# Patient Record
Sex: Female | Born: 1970 | Race: White | Hispanic: No | Marital: Married | Smoking: Never smoker
Health system: Southern US, Community
[De-identification: ages and names within clinical notes are randomized; demographics above are authoritative.]

## PROBLEM LIST (undated history)

## (undated) ENCOUNTER — Inpatient Hospital Stay (HOSPITAL_COMMUNITY): Payer: Self-pay

## (undated) DIAGNOSIS — R609 Edema, unspecified: Secondary | ICD-10-CM

## (undated) DIAGNOSIS — G932 Benign intracranial hypertension: Secondary | ICD-10-CM

## (undated) DIAGNOSIS — J45909 Unspecified asthma, uncomplicated: Secondary | ICD-10-CM

## (undated) DIAGNOSIS — IMO0001 Reserved for inherently not codable concepts without codable children: Secondary | ICD-10-CM

## (undated) DIAGNOSIS — I10 Essential (primary) hypertension: Secondary | ICD-10-CM

## (undated) DIAGNOSIS — Z794 Long term (current) use of insulin: Secondary | ICD-10-CM

## (undated) DIAGNOSIS — E119 Type 2 diabetes mellitus without complications: Secondary | ICD-10-CM

## (undated) DIAGNOSIS — I639 Cerebral infarction, unspecified: Secondary | ICD-10-CM

---

## 2000-12-31 ENCOUNTER — Encounter: Payer: Self-pay | Admitting: Emergency Medicine

## 2000-12-31 ENCOUNTER — Emergency Department (HOSPITAL_COMMUNITY): Admission: EM | Admit: 2000-12-31 | Discharge: 2000-12-31 | Payer: Self-pay | Admitting: Emergency Medicine

## 2009-01-31 ENCOUNTER — Emergency Department (HOSPITAL_COMMUNITY): Admission: EM | Admit: 2009-01-31 | Discharge: 2009-01-31 | Payer: Self-pay | Admitting: *Deleted

## 2009-04-09 ENCOUNTER — Emergency Department (HOSPITAL_COMMUNITY): Admission: EM | Admit: 2009-04-09 | Discharge: 2009-04-09 | Payer: Self-pay | Admitting: Emergency Medicine

## 2011-03-07 LAB — COMPREHENSIVE METABOLIC PANEL
Alkaline Phosphatase: 110 U/L (ref 39–117)
BUN: 12 mg/dL (ref 6–23)
Creatinine, Ser: 0.84 mg/dL (ref 0.4–1.2)
Glucose, Bld: 178 mg/dL — ABNORMAL HIGH (ref 70–99)
Potassium: 2.8 mEq/L — ABNORMAL LOW (ref 3.5–5.1)
Total Bilirubin: 0.5 mg/dL (ref 0.3–1.2)
Total Protein: 7.9 g/dL (ref 6.0–8.3)

## 2011-03-07 LAB — URINALYSIS, ROUTINE W REFLEX MICROSCOPIC
Nitrite: NEGATIVE
Protein, ur: NEGATIVE mg/dL
Specific Gravity, Urine: 1.018 (ref 1.005–1.030)
Urobilinogen, UA: 1 mg/dL (ref 0.0–1.0)

## 2011-03-07 LAB — CBC
HCT: 41.9 % (ref 36.0–46.0)
Hemoglobin: 14.3 g/dL (ref 12.0–15.0)
MCV: 87.1 fL (ref 78.0–100.0)
Platelets: 342 10*3/uL (ref 150–400)
RDW: 15.6 % — ABNORMAL HIGH (ref 11.5–15.5)

## 2011-03-07 LAB — DIFFERENTIAL
Basophils Absolute: 0 10*3/uL (ref 0.0–0.1)
Basophils Relative: 0 % (ref 0–1)
Lymphocytes Relative: 22 % (ref 12–46)
Neutro Abs: 10.3 10*3/uL — ABNORMAL HIGH (ref 1.7–7.7)
Neutrophils Relative %: 74 % (ref 43–77)

## 2011-03-07 LAB — URINE MICROSCOPIC-ADD ON

## 2013-06-25 ENCOUNTER — Emergency Department (HOSPITAL_COMMUNITY): Payer: BC Managed Care – PPO

## 2013-06-25 ENCOUNTER — Emergency Department (HOSPITAL_COMMUNITY)
Admission: EM | Admit: 2013-06-25 | Discharge: 2013-06-26 | Disposition: A | Discharge status: Home / Self Care | Payer: BC Managed Care – PPO | Attending: Emergency Medicine | Admitting: Emergency Medicine

## 2013-06-25 ENCOUNTER — Encounter (HOSPITAL_COMMUNITY): Payer: Self-pay | Admitting: *Deleted

## 2013-06-25 DIAGNOSIS — Z880 Allergy status to penicillin: Secondary | ICD-10-CM | POA: Insufficient documentation

## 2013-06-25 DIAGNOSIS — Z91040 Latex allergy status: Secondary | ICD-10-CM | POA: Insufficient documentation

## 2013-06-25 DIAGNOSIS — R609 Edema, unspecified: Secondary | ICD-10-CM | POA: Insufficient documentation

## 2013-06-25 DIAGNOSIS — R635 Abnormal weight gain: Secondary | ICD-10-CM | POA: Insufficient documentation

## 2013-06-25 DIAGNOSIS — E119 Type 2 diabetes mellitus without complications: Secondary | ICD-10-CM | POA: Insufficient documentation

## 2013-06-25 DIAGNOSIS — Z79899 Other long term (current) drug therapy: Secondary | ICD-10-CM | POA: Insufficient documentation

## 2013-06-25 DIAGNOSIS — I10 Essential (primary) hypertension: Secondary | ICD-10-CM | POA: Insufficient documentation

## 2013-06-25 DIAGNOSIS — R0602 Shortness of breath: Secondary | ICD-10-CM | POA: Insufficient documentation

## 2013-06-25 DIAGNOSIS — R1110 Vomiting, unspecified: Secondary | ICD-10-CM | POA: Insufficient documentation

## 2013-06-25 DIAGNOSIS — E876 Hypokalemia: Secondary | ICD-10-CM | POA: Insufficient documentation

## 2013-06-25 DIAGNOSIS — M7989 Other specified soft tissue disorders: Secondary | ICD-10-CM | POA: Insufficient documentation

## 2013-06-25 DIAGNOSIS — Z8669 Personal history of other diseases of the nervous system and sense organs: Secondary | ICD-10-CM | POA: Insufficient documentation

## 2013-06-25 DIAGNOSIS — Z794 Long term (current) use of insulin: Secondary | ICD-10-CM | POA: Insufficient documentation

## 2013-06-25 HISTORY — DX: Reserved for inherently not codable concepts without codable children: IMO0001

## 2013-06-25 HISTORY — DX: Long term (current) use of insulin: Z79.4

## 2013-06-25 HISTORY — DX: Benign intracranial hypertension: G93.2

## 2013-06-25 HISTORY — DX: Essential (primary) hypertension: I10

## 2013-06-25 HISTORY — DX: Edema, unspecified: R60.9

## 2013-06-25 HISTORY — DX: Type 2 diabetes mellitus without complications: E11.9

## 2013-06-25 LAB — CBC WITH DIFFERENTIAL/PLATELET
Eosinophils Absolute: 0.2 10*3/uL (ref 0.0–0.7)
Eosinophils Relative: 2 % (ref 0–5)
HCT: 33.8 % — ABNORMAL LOW (ref 36.0–46.0)
Hemoglobin: 10.9 g/dL — ABNORMAL LOW (ref 12.0–15.0)
Lymphs Abs: 4.2 10*3/uL — ABNORMAL HIGH (ref 0.7–4.0)
MCH: 28 pg (ref 26.0–34.0)
MCV: 86.9 fL (ref 78.0–100.0)
Monocytes Absolute: 0.5 10*3/uL (ref 0.1–1.0)
Monocytes Relative: 4 % (ref 3–12)
Neutrophils Relative %: 55 % (ref 43–77)
RBC: 3.89 MIL/uL (ref 3.87–5.11)

## 2013-06-25 MED ORDER — SODIUM CHLORIDE 0.9 % IV SOLN: INTRAVENOUS | Status: DC

## 2013-06-25 NOTE — ED Notes (Signed)
WUJ:[id number]<JY> Expected date:<BR> Expected time:<BR> Means of arrival:<BR> Comments:<BR> Expected patient Suzanne Pugh: Tuesday K+2.8 was advised to stop HCTZ. Patient c/o +10 pounds weight gain, today with SOB and Chest Pain. &quot;feels like an elephant on her chest&quot; PA at PCP called in, patient coming via POV.

## 2013-06-25 NOTE — ED Notes (Signed)
Pt states she has gained 9 pounds since Thursday and she is sob and having chest pain her potassium is 2.8 on Thursday,  Pt put on multiple medications this Thursday by her PCP she is also nauseated

## 2013-06-25 NOTE — ED Notes (Signed)
Dr. Wentz at bedside. 

## 2013-06-25 NOTE — ED Provider Notes (Signed)
CSN: [id number]     Arrival date & time 06/25/13  2259 History     First MD Initiated Contact with Patient 06/25/13 2303     Chief Complaint  Patient presents with  . Chest Pain  . Shortness of Breath  . Nausea   (Consider location/radiation/quality/duration/timing/severity/associated sxs/prior Treatment) HPI Comments: Suzanne Pugh is a 42 y.o. Female presents for evaluation of shortness of breath, weight gain and leg swelling. Symptoms started after she stopped taking hydrochlorothiazide 4 days ago. She continues to take Lasix. Her PCP called in a prescription for potassium 1 days ago. She denies chest pain. She's had nausea without vomiting since one hour ago. She denies diarrhea. She denies headache, blurred vision, diplopia, back, pain, weakness, or paresthesias. There are no known modifying factors.  Patient is a 42 y.o. female presenting with chest pain and shortness of breath.  Chest Pain Associated symptoms: shortness of breath   Shortness of Breath Associated symptoms: chest pain     Past Medical History  Diagnosis Date  . Insulin dependent diabetes mellitus   . Hypertension   . Pseudotumor cerebri   . Peripheral edema    No past surgical history on file. No family history on file. History  Substance Use Topics  . Smoking status: Never Smoker   . Smokeless tobacco: Not on file  . Alcohol Use: No   OB History   Grav Para Term Preterm Abortions TAB SAB Ect Mult Living                 Review of Systems  Respiratory: Positive for shortness of breath.   Cardiovascular: Positive for chest pain.  All other systems reviewed and are negative.    Allergies  Calcium channel blockers; Dilaudid; Hyzaar; Imitrex; Latex; Micardis; Norvasc; Percocet; Procardia; Toprol xl; Advair diskus; Amaryl; Byetta 10 mcg pen; Bystolic; Lovaza; Metformin and related; Methadone; Morphine and related; Topamax; Zocor; Amoxicillin; and Penicillins  Home Medications   Current Outpatient Rx   Name  Route  Sig  Dispense  Refill  . Biotin (BIOTIN 5000) 5 MG CAPS   Oral   Take 1 capsule by mouth every evening.         . calcium-vitamin D (OSCAL WITH D) 500-200 MG-UNIT per tablet   Oral   Take 1 tablet by mouth every evening.         . cholecalciferol (VITAMIN D) 1000 UNITS tablet   Oral   Take 1,000 Units by mouth every evening.         . cyanocobalamin 500 MCG tablet   Oral   Take 500 mcg by mouth every evening.         . furosemide (LASIX) 40 MG tablet   Oral   Take 40 mg by mouth every morning.         . insulin glulisine (APIDRA) 100 UNIT/ML injection   Subcutaneous   Inject 5 Units into the skin 3 (three) times daily before meals. For BS above 150         . insulin NPH (HUMULIN N,NOVOLIN N) 100 UNIT/ML injection   Subcutaneous   Inject 40 Units into the skin 2 (two) times daily.         Marland Kitchen lactase (LACTAID) 3000 UNITS tablet   Oral   Take 1 tablet by mouth daily as needed (gi upset from diet).         Marland Kitchen levothyroxine (SYNTHROID, LEVOTHROID) 88 MCG tablet   Oral   Take 88 mcg by  mouth daily before breakfast.         . Multiple Vitamin (MULTIVITAMIN WITH MINERALS) TABS   Oral   Take 1 tablet by mouth every evening.         Marland Kitchen OVER THE COUNTER MEDICATION   Oral   Take 1 capsule by mouth every evening. Liver Tonic supplement         . OVER THE COUNTER MEDICATION   Oral   Take 1 capsule by mouth every evening. Respirtonic supplement         . potassium chloride (K-DUR,KLOR-CON) 10 MEQ tablet   Oral   Take 20 mEq by mouth 2 (two) times daily.         . psyllium (REGULOID) 0.52 G capsule   Oral   Take 3 capsules by mouth every evening.         . saccharomyces boulardii (FLORASTOR) 250 MG capsule   Oral   Take 250 mg by mouth every evening.         . Turmeric 450 MG CAPS   Oral   Take 1 capsule by mouth daily as needed (pain).         . vitamin C (ASCORBIC ACID) 500 MG tablet   Oral   Take 500 mg by mouth every  evening.         . zinc gluconate 50 MG tablet   Oral   Take 50 mg by mouth every evening.          BP 143/74  Pulse 73  Temp(Src) 98.4 F (36.9 C) (Oral)  Resp 24  Wt 205 lb 3.2 oz (93.078 kg)  SpO2 96%  LMP 06/25/2013 Physical Exam  Nursing note and vitals reviewed. Constitutional: She is oriented to person, place, and time. She appears well-developed and well-nourished.  HENT:  Head: Normocephalic and atraumatic.  Eyes: Conjunctivae and EOM are normal. Pupils are equal, round, and reactive to light.  Neck: Normal range of motion and phonation normal. Neck supple.  Cardiovascular: Normal rate, regular rhythm and intact distal pulses.   Pulmonary/Chest: Effort normal and breath sounds normal. No respiratory distress. She has no wheezes. She has no rales. She exhibits no tenderness.  Abdominal: Soft. She exhibits no distension. There is no tenderness. There is no guarding.  Musculoskeletal: Normal range of motion. She exhibits edema (2+ bilateral lower extremities).  Neurological: She is alert and oriented to person, place, and time. She has normal strength. She exhibits normal muscle tone.  Skin: Skin is warm and dry.  Psychiatric: Her behavior is normal. Judgment and thought content normal.  Anxious    ED Course   Procedures (including critical care time)  Medications  0.9 %  sodium chloride infusion ( Intravenous Hold 06/26/13 0155)  potassium chloride SA (K-DUR,KLOR-CON) CR tablet 40 mEq (40 mEq Oral Given 06/26/13 0141)    Patient Vitals for the past 24 hrs:  BP Temp Temp src Pulse Resp SpO2 Weight  06/26/13 0400 143/74 mmHg - - 73 24 96 % -  06/26/13 0345 - - - 79 19 95 % -  06/26/13 0330 - - - 82 21 96 % -  06/26/13 0315 - - - 74 22 95 % -  06/26/13 0300 137/81 mmHg - - 77 14 96 % -  06/26/13 0257 145/80 mmHg 98.4 F (36.9 C) Oral 75 20 96 % -  06/26/13 0203 - - - - - - 205 lb 3.2 oz (93.078 kg)  06/25/13 2306 161/98 mmHg 98.2 F (  36.8 C) Oral 77 22 95 % -      Date: 06/25/13  Rate: 79  Rhythm: normal sinus rhythm  QRS Axis: normal  PR and QT Intervals: normal  ST/T Wave abnormalities: normal  PR and QRS Conduction Disutrbances:none  Narrative Interpretation:   Old EKG Reviewed: unchanged- 12/31/00   Labs Reviewed  CBC WITH DIFFERENTIAL - Abnormal; Notable for the following:    WBC 10.9 (*)    Hemoglobin 10.9 (*)    HCT 33.8 (*)    Lymphs Abs 4.2 (*)    All other components within normal limits  BASIC METABOLIC PANEL - Abnormal; Notable for the following:    Potassium 3.0 (*)    Glucose, Bld 167 (*)    All other components within normal limits  PRO B NATRIURETIC PEPTIDE - Abnormal; Notable for the following:    Pro B Natriuretic peptide (BNP) 202.0 (*)    All other components within normal limits  URINALYSIS, ROUTINE W REFLEX MICROSCOPIC - Abnormal; Notable for the following:    Hgb urine dipstick MODERATE (*)    All other components within normal limits  TROPONIN I  URINE RAPID DRUG SCREEN (HOSP PERFORMED)  ETHANOL  URINE MICROSCOPIC-ADD ON   Dg Chest 2 View  06/26/2013   *RADIOLOGY REPORT*  Clinical Data: Chest pain, shortness of breath  CHEST - 2 VIEW  Comparison: None.  Findings: The transverse heart size is mildly enlarged. Mediastinal silhouette is within normal limits.  The lungs are well inflated.  No pulmonary edema or pleural effusion is identified.  There is no airspace consolidation.  No pneumothorax.  Osseous structures are within normal limits.  IMPRESSION: 1.  Mild cardiomegaly without pulmonary edema or radiographic evidence of heart failure. 2.  No focal infiltrate to suggest acute infectious pneumonitis identified.   Original Report Authenticated By: Rise Mu, M.D.   1. Peripheral edema   2. Hypokalemia   3. Hypertension     MDM  Nonspecific symptoms, including peripheral edema. Blood pressure stabilized spontaneously, in the emergency department.Doubt metabolic instability, serious bacterial  infection or impending vascular collapse; the patient is stable for discharge.  Nursing Notes Reviewed/ Care Coordinated, and agree without changes. Applicable Imaging Reviewed.  Interpretation of Laboratory Data incorporated into ED treatment   Plan: Home Medications-  Usual- she is advised to continue taking potassium 20 mEq twice a day for 7-10 days ; Home Treatments and Observation-  Rest , watch for progressive symptoms, elevate legs as much as possible; return here if the recommended treatment, does not improve the symptoms; Recommended follow up- PCP 5-7 days    Flint Melter, MD 06/26/13 [phone]

## 2013-06-26 LAB — BASIC METABOLIC PANEL
BUN: 10 mg/dL (ref 6–23)
GFR calc non Af Amer: >90 mL/min (ref >90)
Glucose, Bld: 167 mg/dL — ABNORMAL HIGH (ref 70–99)
Potassium: 3 mEq/L — ABNORMAL LOW (ref 3.5–5.1)

## 2013-06-26 LAB — RAPID URINE DRUG SCREEN, HOSP PERFORMED
Amphetamines: NOT DETECTED
Benzodiazepines: NOT DETECTED
Opiates: NOT DETECTED

## 2013-06-26 LAB — URINALYSIS, ROUTINE W REFLEX MICROSCOPIC
Glucose, UA: NEGATIVE mg/dL
Ketones, ur: NEGATIVE mg/dL
Leukocytes, UA: NEGATIVE
pH: 7.5 (ref 5.0–8.0)

## 2013-06-26 MED ORDER — POTASSIUM CHLORIDE CRYS ER 20 MEQ PO TBCR
40.0000 meq | EXTENDED_RELEASE_TABLET | Freq: Once | ORAL | Status: AC
  Administered 2013-06-26: 40 meq via ORAL
  Filled 2013-06-26: qty 2

## 2013-06-26 NOTE — ED Notes (Signed)
Pt has been to urinate multiple times

## 2017-06-27 ENCOUNTER — Other Ambulatory Visit: Payer: Self-pay

## 2017-06-27 ENCOUNTER — Emergency Department (HOSPITAL_COMMUNITY): Payer: BLUE CROSS/BLUE SHIELD

## 2017-06-27 ENCOUNTER — Observation Stay (HOSPITAL_COMMUNITY): Payer: BLUE CROSS/BLUE SHIELD

## 2017-06-27 ENCOUNTER — Observation Stay (HOSPITAL_COMMUNITY)
Admission: EM | Admit: 2017-06-27 | Discharge: 2017-06-29 | Disposition: A | Discharge status: Home / Self Care | Payer: BLUE CROSS/BLUE SHIELD | Attending: Nephrology | Admitting: Nephrology

## 2017-06-27 ENCOUNTER — Encounter (HOSPITAL_COMMUNITY): Payer: Self-pay | Admitting: Emergency Medicine

## 2017-06-27 DIAGNOSIS — R55 Syncope and collapse: Principal | ICD-10-CM | POA: Insufficient documentation

## 2017-06-27 DIAGNOSIS — I10 Essential (primary) hypertension: Secondary | ICD-10-CM | POA: Diagnosis not present

## 2017-06-27 DIAGNOSIS — D72829 Elevated white blood cell count, unspecified: Secondary | ICD-10-CM | POA: Insufficient documentation

## 2017-06-27 DIAGNOSIS — Z6836 Body mass index (BMI) 36.0-36.9, adult: Secondary | ICD-10-CM | POA: Insufficient documentation

## 2017-06-27 DIAGNOSIS — Z7902 Long term (current) use of antithrombotics/antiplatelets: Secondary | ICD-10-CM | POA: Insufficient documentation

## 2017-06-27 DIAGNOSIS — Z79899 Other long term (current) drug therapy: Secondary | ICD-10-CM | POA: Insufficient documentation

## 2017-06-27 DIAGNOSIS — E119 Type 2 diabetes mellitus without complications: Secondary | ICD-10-CM | POA: Diagnosis not present

## 2017-06-27 DIAGNOSIS — E785 Hyperlipidemia, unspecified: Secondary | ICD-10-CM | POA: Diagnosis not present

## 2017-06-27 DIAGNOSIS — E876 Hypokalemia: Secondary | ICD-10-CM | POA: Insufficient documentation

## 2017-06-27 DIAGNOSIS — I69392 Facial weakness following cerebral infarction: Secondary | ICD-10-CM | POA: Diagnosis not present

## 2017-06-27 DIAGNOSIS — D649 Anemia, unspecified: Secondary | ICD-10-CM | POA: Insufficient documentation

## 2017-06-27 DIAGNOSIS — Z8673 Personal history of transient ischemic attack (TIA), and cerebral infarction without residual deficits: Secondary | ICD-10-CM | POA: Insufficient documentation

## 2017-06-27 DIAGNOSIS — N179 Acute kidney failure, unspecified: Secondary | ICD-10-CM | POA: Insufficient documentation

## 2017-06-27 DIAGNOSIS — Z794 Long term (current) use of insulin: Secondary | ICD-10-CM | POA: Insufficient documentation

## 2017-06-27 DIAGNOSIS — I959 Hypotension, unspecified: Secondary | ICD-10-CM | POA: Insufficient documentation

## 2017-06-27 DIAGNOSIS — I69354 Hemiplegia and hemiparesis following cerebral infarction affecting left non-dominant side: Secondary | ICD-10-CM | POA: Insufficient documentation

## 2017-06-27 DIAGNOSIS — E669 Obesity, unspecified: Secondary | ICD-10-CM | POA: Diagnosis not present

## 2017-06-27 DIAGNOSIS — E1169 Type 2 diabetes mellitus with other specified complication: Secondary | ICD-10-CM

## 2017-06-27 HISTORY — DX: Cerebral infarction, unspecified: I63.9

## 2017-06-27 HISTORY — DX: Unspecified asthma, uncomplicated: J45.909

## 2017-06-27 LAB — CBC WITH DIFFERENTIAL/PLATELET
BASOS ABS: 0 10*3/uL (ref 0.0–0.1)
BASOS PCT: 0 %
Eosinophils Absolute: 0.1 10*3/uL (ref 0.0–0.7)
Eosinophils Relative: 1 %
HEMATOCRIT: 36.2 % (ref 36.0–46.0)
Hemoglobin: 11.7 g/dL — ABNORMAL LOW (ref 12.0–15.0)
Lymphocytes Relative: 33 %
Lymphs Abs: 4.9 10*3/uL — ABNORMAL HIGH (ref 0.7–4.0)
MCH: 28.3 pg (ref 26.0–34.0)
MCHC: 32.3 g/dL (ref 30.0–36.0)
MCV: 87.7 fL (ref 78.0–100.0)
MONO ABS: 0.7 10*3/uL (ref 0.1–1.0)
Monocytes Relative: 5 %
NEUTROS ABS: 9.3 10*3/uL — AB (ref 1.7–7.7)
Neutrophils Relative %: 61 %
PLATELETS: 428 10*3/uL — AB (ref 150–400)
RBC: 4.13 MIL/uL (ref 3.87–5.11)
RDW: 14.7 % (ref 11.5–15.5)
WBC: 15.1 10*3/uL — AB (ref 4.0–10.5)

## 2017-06-27 LAB — I-STAT CHEM 8, ED
BUN: 21 mg/dL — AB (ref 6–20)
CREATININE: 1.5 mg/dL — AB (ref 0.44–1.00)
Calcium, Ion: 0.99 mmol/L — ABNORMAL LOW (ref 1.15–1.40)
Chloride: 93 mmol/L — ABNORMAL LOW (ref 101–111)
GLUCOSE: 139 mg/dL — AB (ref 65–99)
HCT: 35 % — ABNORMAL LOW (ref 36.0–46.0)
HEMOGLOBIN: 11.9 g/dL — AB (ref 12.0–15.0)
POTASSIUM: 2.3 mmol/L — AB (ref 3.5–5.1)
Sodium: 139 mmol/L (ref 135–145)
TCO2: 30 mmol/L (ref 0–100)

## 2017-06-27 LAB — I-STAT TROPONIN, ED: Troponin i, poc: 0 ng/mL (ref 0.00–0.08)

## 2017-06-27 LAB — GLUCOSE, CAPILLARY: GLUCOSE-CAPILLARY: 114 mg/dL — AB (ref 65–99)

## 2017-06-27 LAB — COMPREHENSIVE METABOLIC PANEL
ALBUMIN: 3.4 g/dL — AB (ref 3.5–5.0)
ALT: 46 U/L (ref 14–54)
AST: 46 U/L — AB (ref 15–41)
Alkaline Phosphatase: 94 U/L (ref 38–126)
Anion gap: 11 (ref 5–15)
BILIRUBIN TOTAL: 0.5 mg/dL (ref 0.3–1.2)
BUN: 19 mg/dL (ref 6–20)
CO2: 32 mmol/L (ref 22–32)
Calcium: 8.9 mg/dL (ref 8.9–10.3)
Chloride: 96 mmol/L — ABNORMAL LOW (ref 101–111)
Creatinine, Ser: 1.66 mg/dL — ABNORMAL HIGH (ref 0.44–1.00)
GFR calc Af Amer: 42 mL/min — ABNORMAL LOW (ref >60)
GFR calc non Af Amer: 36 mL/min — ABNORMAL LOW (ref >60)
GLUCOSE: 143 mg/dL — AB (ref 65–99)
POTASSIUM: 2.4 mmol/L — AB (ref 3.5–5.1)
Sodium: 139 mmol/L (ref 135–145)
Total Protein: 7.5 g/dL (ref 6.5–8.1)

## 2017-06-27 LAB — MAGNESIUM: MAGNESIUM: 2.1 mg/dL (ref 1.7–2.4)

## 2017-06-27 LAB — TSH: TSH: 1.205 u[IU]/mL (ref 0.350–4.500)

## 2017-06-27 LAB — PROTIME-INR
INR: 1
Prothrombin Time: 13.2 seconds (ref 11.4–15.2)

## 2017-06-27 LAB — APTT: aPTT: 28 seconds (ref 24–36)

## 2017-06-27 MED ORDER — POTASSIUM CHLORIDE IN NACL 40-0.9 MEQ/L-% IV SOLN
INTRAVENOUS | Status: DC
  Administered 2017-06-28: 100 mL/h via INTRAVENOUS
  Filled 2017-06-27 (×2): qty 1000

## 2017-06-27 MED ORDER — CLOPIDOGREL BISULFATE 75 MG PO TABS
75.0000 mg | ORAL_TABLET | Freq: Every day | ORAL | Status: DC
  Administered 2017-06-28 – 2017-06-29 (×2): 75 mg via ORAL
  Filled 2017-06-27 (×2): qty 1

## 2017-06-27 MED ORDER — CYANOCOBALAMIN 500 MCG PO TABS: 500.0000 ug | ORAL_TABLET | Freq: Every evening | ORAL | Status: DC

## 2017-06-27 MED ORDER — ACETAMINOPHEN 650 MG RE SUPP: 650.0000 mg | Freq: Four times a day (QID) | RECTAL | Status: DC | PRN

## 2017-06-27 MED ORDER — POTASSIUM CHLORIDE 10 MEQ/100ML IV SOLN
10.0000 meq | INTRAVENOUS | Status: AC
  Administered 2017-06-27 (×2): 10 meq via INTRAVENOUS
  Filled 2017-06-27 (×2): qty 100

## 2017-06-27 MED ORDER — ENOXAPARIN SODIUM 30 MG/0.3ML ~~LOC~~ SOLN: 30.0000 mg | SUBCUTANEOUS | Status: DC

## 2017-06-27 MED ORDER — POTASSIUM CHLORIDE 10 MEQ/100ML IV SOLN
10.0000 meq | Freq: Once | INTRAVENOUS | Status: AC
  Administered 2017-06-27: 10 meq via INTRAVENOUS
  Filled 2017-06-27: qty 100

## 2017-06-27 MED ORDER — SODIUM CHLORIDE 0.9 % IV BOLUS (SEPSIS)
1000.0000 mL | Freq: Once | INTRAVENOUS | Status: AC
  Administered 2017-06-27: 1000 mL via INTRAVENOUS

## 2017-06-27 MED ORDER — INSULIN ASPART 100 UNIT/ML ~~LOC~~ SOLN
0.0000 [IU] | Freq: Three times a day (TID) | SUBCUTANEOUS | Status: DC
  Administered 2017-06-28: 1 [IU] via SUBCUTANEOUS
  Administered 2017-06-28 – 2017-06-29 (×3): 2 [IU] via SUBCUTANEOUS

## 2017-06-27 MED ORDER — INSULIN NPH (HUMAN) (ISOPHANE) 100 UNIT/ML ~~LOC~~ SUSP: 20.0000 [IU] | Freq: Two times a day (BID) | SUBCUTANEOUS | Status: DC

## 2017-06-27 MED ORDER — INSULIN ASPART PROT & ASPART (70-30 MIX) 100 UNIT/ML ~~LOC~~ SUSP
40.0000 [IU] | Freq: Two times a day (BID) | SUBCUTANEOUS | Status: DC
  Administered 2017-06-28 – 2017-06-29 (×3): 40 [IU] via SUBCUTANEOUS
  Filled 2017-06-27 (×2): qty 10

## 2017-06-27 MED ORDER — LEVOTHYROXINE SODIUM 88 MCG PO TABS
88.0000 ug | ORAL_TABLET | Freq: Every day | ORAL | Status: DC
  Administered 2017-06-28 – 2017-06-29 (×2): 88 ug via ORAL
  Filled 2017-06-27 (×2): qty 1

## 2017-06-27 MED ORDER — ENOXAPARIN SODIUM 40 MG/0.4ML ~~LOC~~ SOLN
40.0000 mg | SUBCUTANEOUS | Status: DC
  Administered 2017-06-28 – 2017-06-29 (×2): 40 mg via SUBCUTANEOUS
  Filled 2017-06-27 (×2): qty 0.4

## 2017-06-27 MED ORDER — ONDANSETRON HCL 4 MG PO TABS: 4.0000 mg | ORAL_TABLET | Freq: Four times a day (QID) | ORAL | Status: DC | PRN

## 2017-06-27 MED ORDER — ONDANSETRON HCL 4 MG/2ML IJ SOLN: 4.0000 mg | Freq: Four times a day (QID) | INTRAMUSCULAR | Status: DC | PRN

## 2017-06-27 MED ORDER — ADULT MULTIVITAMIN W/MINERALS CH
1.0000 | ORAL_TABLET | Freq: Every evening | ORAL | Status: DC
  Administered 2017-06-28: 1 via ORAL
  Filled 2017-06-27: qty 1

## 2017-06-27 MED ORDER — ROSUVASTATIN CALCIUM 10 MG PO TABS: 10.0000 mg | ORAL_TABLET | Freq: Every day | ORAL | Status: DC

## 2017-06-27 MED ORDER — POTASSIUM CHLORIDE 20 MEQ PO PACK
40.0000 meq | PACK | Freq: Two times a day (BID) | ORAL | Status: DC
  Administered 2017-06-28 – 2017-06-29 (×3): 40 meq via ORAL
  Filled 2017-06-27 (×5): qty 2

## 2017-06-27 MED ORDER — VITAMIN D 1000 UNITS PO TABS
1000.0000 [IU] | ORAL_TABLET | Freq: Every evening | ORAL | Status: DC
  Administered 2017-06-28: 1000 [IU] via ORAL
  Filled 2017-06-27: qty 1

## 2017-06-27 MED ORDER — ACETAMINOPHEN 325 MG PO TABS
650.0000 mg | ORAL_TABLET | Freq: Four times a day (QID) | ORAL | Status: DC | PRN
  Administered 2017-06-28: 650 mg via ORAL
  Filled 2017-06-27: qty 2

## 2017-06-27 NOTE — H&P (Addendum)
History and Physical    Suzanne HampshireMisty Grimaldo NGE:[id number] DOB: 05/11/1971 DOA: 06/27/2017  PCP: Patient, No Pcp Per  Patient coming from: Home  Chief Complaint:syncope  HPI: Suzanne Pugh is a 46 y.o. female with medical history significant of hypertension, insulin-dependent diabetes, pseudotumor cerebri with chronic headache, hypothyroidism, acute right MCA stroke [facility] a [facility] with residual left-sided weakness and facial drooping, presented with syncopal episode at outside. Patient and her husband reported that they were outside when patient suddenly felt lightheaded cold clammy and later on patient syncopized. Bystander called 911 when the patient was found to have systolic blood pressure of 70s treated with IV fluid with improvement in blood pressure and brought to the hospital as a code stroke. Patient was already evaluated by neurologist. Blood pressure improved. Patient reported that her left upper extremity weakness is worsened for about a week. She was seen by her neurologist fewDays ago when labs were done when she was found to have a low potassium. As per patient the blood pressure medications were adjusted recently with the addition of clonidine patch and up titrating the dose of losartan. ED Course: In the ER patient was found to have hypokalemia and hypertension. Treated with IV fluid and potassium chloride. Patient clinically improved to around baseline. Admitted for further evaluation. Denied headache, dizziness, nausea vomiting. Reported chest discomfort and shortness of breath before syncopal episode.  Review of Systems: As per HPI otherwise 10 point review of systems negative.    Past Medical History:  Diagnosis Date  . Hypertension   . Insulin dependent diabetes mellitus (HCC)   . Peripheral edema   . Pseudotumor cerebri     History reviewed. No pertinent surgical history.  Social history: reports that she has never smoked. She does not have any smokeless  tobacco history on file. She reports that she does not drink alcohol or use drugs.  Allergies  Allergen Reactions  . Calcium Channel Blockers Shortness Of Breath  . Dilaudid [Hydromorphone Hcl] Shortness Of Breath    Chest pain  . Hyzaar [Losartan Potassium-Hctz] Shortness Of Breath and Other (See Comments)    Chest pain  . Imitrex [Sumatriptan] Shortness Of Breath and Other (See Comments)    Chest pain  . Latex Shortness Of Breath, Swelling and Rash  . Micardis [Telmisartan] Shortness Of Breath, Swelling and Other (See Comments)    Chest pain  . Norvasc [Amlodipine Besylate] Shortness Of Breath    Chest pain, swelling  . Percocet [Oxycodone-Acetaminophen] Shortness Of Breath and Other (See Comments)    Chest pain  . Procardia [Nifedipine] Shortness Of Breath, Swelling and Other (See Comments)    Migraine, chest pain  . Toprol Xl [Metoprolol Tartrate] Shortness Of Breath, Swelling and Other (See Comments)    Chest pain  . Advair Diskus [Fluticasone-Salmeterol] Other (See Comments)    Asthma worsens  . Amaryl [Glimepiride] Other (See Comments)    Exacerbation of IBS  . Byetta 10 Mcg Pen [Exenatide] Other (See Comments)    Elevated liver enzymes, fluid retention  . Bystolic [Nebivolol Hcl] Nausea Only and Other (See Comments)    Headache, dizziness, constipation  . Lovaza [Omega-3-Acid Ethyl Esters] Swelling  . Metformin And Related     Exacerbation of IBS  . Methadone Itching  . Morphine And Related Itching    Pt can tolerate with diphenhydramine  . Topamax [Topiramate] Other (See Comments)    Elevated liver enzymes, tiredness, fluid retention  . Zocor [Simvastatin] Other (See Comments)  Muscle pain  . Amoxicillin Itching and Rash  . Penicillins Itching and Rash    History reviewed. No pertinent family history.   Prior to Admission medications   Medication Sig Start Date End Date Taking? Authorizing Provider  Biotin (BIOTIN 5000) 5 MG CAPS Take 1 capsule by mouth  every evening.    [provider]  calcium-vitamin D (OSCAL WITH D) 500-200 MG-UNIT per tablet Take 1 tablet by mouth every evening.    [provider]  cholecalciferol (VITAMIN D) 1000 UNITS tablet Take 1,000 Units by mouth every evening.    [provider]  cyanocobalamin 500 MCG tablet Take 500 mcg by mouth every evening.    [provider]  furosemide (LASIX) 40 MG tablet Take 40 mg by mouth every morning.    [provider]  insulin glulisine (APIDRA) 100 UNIT/ML injection Inject 5 Units into the skin 3 (three) times daily before meals. For BS above 150    [provider]  insulin NPH (HUMULIN N,NOVOLIN N) 100 UNIT/ML injection Inject 40 Units into the skin 2 (two) times daily.    [provider]  lactase (LACTAID) 3000 UNITS tablet Take 1 tablet by mouth daily as needed (gi upset from diet).    [provider]  levothyroxine (SYNTHROID, LEVOTHROID) 88 MCG tablet Take 88 mcg by mouth daily before breakfast.    [provider]  Multiple Vitamin (MULTIVITAMIN WITH MINERALS) TABS Take 1 tablet by mouth every evening.    [provider]  OVER THE COUNTER MEDICATION Take 1 capsule by mouth every evening. Liver Tonic supplement    [provider]  OVER THE COUNTER MEDICATION Take 1 capsule by mouth every evening. Respirtonic supplement    [provider]  potassium chloride (K-DUR,KLOR-CON) 10 MEQ tablet Take 20 mEq by mouth 2 (two) times daily.    [provider]  psyllium (REGULOID) 0.52 G capsule Take 3 capsules by mouth every evening.    [provider]  saccharomyces boulardii (FLORASTOR) 250 MG capsule Take 250 mg by mouth every evening.    [provider]  Turmeric 450 MG CAPS Take 1 capsule by mouth daily as needed (pain).    [provider]  vitamin C (ASCORBIC ACID) 500 MG tablet Take 500 mg by mouth every evening.    [provider]  zinc  gluconate 50 MG tablet Take 50 mg by mouth every evening.    [provider]    Physical Exam: Vitals:   06/27/17 1643 06/27/17 1717 06/27/17 1721  BP:  (!) 107/57   Pulse:  75   Resp:  16   Temp:  97.9 F (36.6 C) 97.9 F (36.6 C)  TempSrc:  Oral   SpO2:  100%   Weight: 91.3 kg (201 lb 5 oz)    Height: 5\' 2"  (1.575 m)        Constitutional: NAD, calm, comfortable Vitals:   06/27/17 1643 06/27/17 1717 06/27/17 1721  BP:  (!) 107/57   Pulse:  75   Resp:  16   Temp:  97.9 F (36.6 C) 97.9 F (36.6 C)  TempSrc:  Oral   SpO2:  100%   Weight: 91.3 kg (201 lb 5 oz)    Height: 5\' 2"  (1.575 m)     ENMT: Mucous membranes are moist. Left facial droop  Neck: normal, supple Respiratory: clear to auscultation bilaterally, no wheezing, no crackles. Normal respiratory effort. No accessory muscle use.  Cardiovascular: Regular rate and  rhythm, no murmurs / rubs / gallops. No extremity edema.   Abdomen: no tenderness, no masses palpated.  Bowel sounds positive.  Musculoskeletal: Weakness and left sided, left upper extremity is  weaker than left lower extremity. Skin: no rashes, lesions, ulcers. No induration Neurologic: Alert awake, oriented. A left-sided weakness and left facial droop.  Psychiatric: Normal judgment and insight. Alert and oriented x 3. Normal mood.    Labs on Admission: I have personally reviewed following labs and imaging studies  CBC:  Recent Labs Lab 06/27/17 1638  HGB 11.9*  HCT 35.0*   Basic Metabolic Panel:  Recent Labs Lab 06/27/17 1638  NA 139  K 2.3*  CL 93*  GLUCOSE 139*  BUN 21*  CREATININE 1.50*   GFR: Estimated Creatinine Clearance: 49.3 mL/min (A) (by C-G formula based on SCr of 1.5 mg/dL (H)). Liver Function Tests: No results for input(s): AST, ALT, ALKPHOS, BILITOT, PROT, ALBUMIN in the last 168 hours. No results for input(s): LIPASE, AMYLASE in the last 168 hours. No results for input(s): AMMONIA in the last 168  hours. Coagulation Profile:  Recent Labs Lab 06/27/17 1732  INR 1.00   Cardiac Enzymes: No results for input(s): CKTOTAL, CKMB, CKMBINDEX, TROPONINI in the last 168 hours. BNP (last 3 results) No results for input(s): PROBNP in the last 8760 hours. HbA1C: No results for input(s): HGBA1C in the last 72 hours. CBG: No results for input(s): GLUCAP in the last 168 hours. Lipid Profile: No results for input(s): CHOL, HDL, LDLCALC, TRIG, CHOLHDL, LDLDIRECT in the last 72 hours. Thyroid Function Tests: No results for input(s): TSH, T4TOTAL, FREET4, T3FREE, THYROIDAB in the last 72 hours. Anemia Panel: No results for input(s): VITAMINB12, FOLATE, FERRITIN, TIBC, IRON, RETICCTPCT in the last 72 hours. Urine analysis:    Component Value Date/Time   COLORURINE YELLOW 06/25/2013 2343   APPEARANCEUR CLEAR 06/25/2013 2343   LABSPEC 1.015 06/25/2013 2343   PHURINE 7.5 06/25/2013 2343   GLUCOSEU NEGATIVE 06/25/2013 2343   HGBUR MODERATE (A) 06/25/2013 2343   BILIRUBINUR NEGATIVE 06/25/2013 2343   KETONESUR NEGATIVE 06/25/2013 2343   PROTEINUR NEGATIVE 06/25/2013 2343   UROBILINOGEN 0.2 06/25/2013 2343   NITRITE NEGATIVE 06/25/2013 2343   LEUKOCYTESUR NEGATIVE 06/25/2013 2343   Sepsis Labs: !!!!!!!!!!!!!!!!!!!!!!!!!!!!!!!!!!!!!!!!!!!! @LABRCNTIP (procalcitonin:4,lacticidven:4) )No results found for this or any previous visit (from the past 240 hour(s)).   Radiological Exams on Admission: Ct Head Code Stroke Wo Contrast  Result Date: 06/27/2017 CLINICAL DATA:  Code stroke. 46 year old female last seen normal 1520 hours. Left side weakness. New rounded fairly circumscribed area of hypodensity along the anterior right operculum EXAM: CT HEAD WITHOUT CONTRAST TECHNIQUE: Contiguous axial images were obtained from the base of the skull through the vertex without intravenous contrast. COMPARISON:  Head CT without contrast 05/31/2017. FINDINGS: Brain: 3 areas of abnormal hypodensity are identified  in the right MCA territory including the inferior insula/operculum (series 3, image 14), right corona radiata (image 21), and right parietal lobe (image 20). Of these, only the insula, operculum lesion was not evident on 05/31/2017, but all 3 areas have similar density suggesting subacute to chronic time course. Mild ex vacuo enlargement of the right lateral ventricle. No acute intracranial hemorrhage identified. No midline shift, mass effect, or evidence of intracranial mass lesion. No ventriculomegaly. No definite acute cortically based infarct identified. Vascular: Mild Calcified atherosclerosis at the skull base. No suspicious intracranial vascular hyperdensity. Skull: Stable. Hyperostosis of the calvarium. No acute osseous abnormality identified. Sinuses/Orbits: Visualized paranasal sinuses and mastoids are stable  and well pneumatized. Other: No acute orbit or scalp soft tissue findings. ASPECTS [facility] Stroke Program Early CT Score) Total score (0-10 with 10 being normal): 10 IMPRESSION: 1. Three areas of subacute to chronic appearing ischemia in the right MCA territory, most of which were evident on the 05/31/2017 prior. No definite acute cortically based infarct and no acute intracranial hemorrhage identified. 2. ASPECTS is 10. 3. Study discussed by telephone with Dr. Roda Shutters On 06/27/2017 at 16:44 . Electronically Signed   By: Odessa Fleming M.D.   On: 06/27/2017 16:48    EKG: Independently reviewed. Sinus rhythm with PVCs.  Assessment/Plan Active Problems:   Syncope and collapse   Hypotension, unspecified  # Syncope in the setting of hypotension: -Patient was found to have systolic blood pressure of 70s by EMS. Antihypertensive medications were recently adjusted including addition of clonidine and up titration of losartan as per patient and her husband. Patient received IV fluid and now her mental status is at baseline. -Continue IV fluid, hold Lasix, losartan and clonidine patch. -CT scan of the head  consistent with recent stroke. MRI of the brain ordered by neurologist.  -Check orthostatic hypotension -Currently electrolytes -No echo was done recently therefore I order echocardiogram for evaluation.  #Recent right MCA stroke, subacute: -MRI of the brain ordered by neurologist. I discussed with Dr. Roda Shutters from the stroke service. He recommended to continue Plavix and Crestor. Follow-up MRI brain result. -Clear liquid diet and advance as tolerated. Speech and swallow evaluation. Patient gets PT OT at home  -Patient has Holter monitor from outside hospital -I will check A1c and lipid panel.  #Hypokalemia and acute kidney injury: Likely prerenal. -Check UA, serum magnesium level -Continue IV fluid and potassium chloride. -Monitor BMP -Avoid nephrotoxins -Continue IV fluid. Hold Lasix and losartan.  #Diabetes on chronic insulin: Continue lower dose of insulin. Continue sliding scale. Monitor blood sugar level.  #Hyperlipidemia: Follow-up lipid panel in the morning. Continue Crestor  Plan discussed with the patient, her husband and neurologist. F/u pending lab including CBC, Mg level DVT prophylaxis: Lovenox subcutaneous Code Status: Full code Family Communication: Patient's husband Disposition Plan: Likely discharge home in 1-2 days Consults called: Neurologist Admission status: Observation   Jenella Craigie Jaynie Collins MD [facility] Pager [phone]  If 7PM-7AM, please contact night-coverage [email or web address] Password [facility]  06/27/2017, 6:24 PM

## 2017-06-27 NOTE — Progress Notes (Signed)
Escorted family (husband) to bedside.

## 2017-06-27 NOTE — Progress Notes (Signed)
Patient arrived to unit alert and oriented x 4. Patient ambulated with contact gaurd assist from stretcher to bed. Patient placed on tele box 5C10, second verification complete. Patients allergies, PMH, current medications and treatment plan discussed and patient verbally confirmed. Q 2 hr vital signs and neuro checks initiated. Patient oriented to unit, telephone and call light placed within patients reach, bed alarm activated. RN will continue to monitor.

## 2017-06-27 NOTE — ED Notes (Signed)
Delay in lab draw pt in MRI. 

## 2017-06-27 NOTE — ED Notes (Signed)
Unsuccessful attempt to give report to [facility].

## 2017-06-27 NOTE — Code Documentation (Signed)
46 year old presents to [facility] via [place] as code stroke called in the field.  On arrival patient is alert - warm and dry - denies pain - has facial droop, left side weakness and some dysarthria - patient states this is from a previous stroke in July of this year.  EMS reports she was with husband shopping - had onset of headache, nausea and dizziness then had brief episode of unresponivness.  No report of any jerking movement - no snoring resps - and no incontinence.  BP on EMS exam was 70/systolic.   Patient was given 300 cc NS in route.  On arrival to ED she reports she is back to her baseline. She reports some recent changes in her BP medicines.   She was met at the bridge by stroke team and taken to CT scan. Clonidine patch was removed due to persistent hypotension.   She has an allergy to dye so no further CT imaging.  Dr. Erlinda Hong and Jennette Dubin PA present.  [facility] - husband presents to room - he states she is almost at baseline but with some increased weakness of left arm/hand.  NS bolus continues - BP 80/systolic.  BP responding to NS bolus. For MRI and admission per Dr. Erlinda Hong.   Bp 122/61.   Handoff to [employer].

## 2017-06-27 NOTE — Consult Note (Signed)
Code stroke note  Referring Physician: Dr. Ethelda Chick    Chief Complaint: near syncope and transient worsening of left-sided weakness  HPI: Suzanne Pugh is an 46 y.o. 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 She was treated in [facility] and MRI showed acute to subacute infarcts in the right frontal and parietal lobes with involvement of the right corona radiata and right centrum semiovale. CTA head and neck cannot be done due to iodine allergy. MRA neck showed no stenosis or thrombosis. MRA head showed right M1 stenosis, at least moderate, and there is a decreased caliber of the distal right MCA branches distal to the stenosis. LDL 143, A1c 9.0. Patient also has allergy to aspirin, was discharged with Plavix and Crestor. On discharge, she still had left facial droop, left hemiparesis.  After discharge, she follows with cardiology and neurology in [facility] and [facility] healthcare, managing her blood pressure and sugar control. She followed with neurologist Dr. Algis Greenhouse on 06/25/17, setting up for external heart monitoring for 14 days and sleep study.  Today, she was using wheelchair at 3:20 PM with her husband, and she felt dizzy and about to pass out. Her husband found her to have slurred speech and worsening left-sided weakness. EMS called. On arrival, her blood pressure 70/40, received fluid bolus, blood pressure up to 112/62. Her CBG 148. She still have a clonidine patch on her chest. With increasing blood pressure, patient and her husband felt her symptoms gradually back to her baseline. Code stroke called. Patient was transferred to [facility] for further evaluation.  Patient has long-term hypertension, not under control, recently follow with PCP and cardiology for BP management, on multiple BP meds as per patient. She has diabetes since age 42, currently on insulin 80 units a day. She is on Crestor for HLD.  LSN: 3:20pm tPA Given: No: Recent stroke, back to  baseline after fluid resuscitation.  Past Medical History:  Diagnosis Date  . Hypertension   . Insulin dependent diabetes mellitus (HCC)   . Peripheral edema   . Pseudotumor cerebri     History reviewed. No pertinent surgical history.  History reviewed. No pertinent family history. Social History:  reports that she has never smoked. She does not have any smokeless tobacco history on file. She reports that she does not drink alcohol or use drugs.  Allergies:  Allergies  Allergen Reactions  . Calcium Channel Blockers Shortness Of Breath  . Dilaudid [Hydromorphone Hcl] Shortness Of Breath    Chest pain  . Hyzaar [Losartan Potassium-Hctz] Shortness Of Breath and Other (See Comments)    Chest pain  . Imitrex [Sumatriptan] Shortness Of Breath and Other (See Comments)    Chest pain  . Latex Shortness Of Breath, Swelling and Rash  . Micardis [Telmisartan] Shortness Of Breath, Swelling and Other (See Comments)    Chest pain  . Norvasc [Amlodipine Besylate] Shortness Of Breath    Chest pain, swelling  . Percocet [Oxycodone-Acetaminophen] Shortness Of Breath and Other (See Comments)    Chest pain  . Procardia [Nifedipine] Shortness Of Breath, Swelling and Other (See Comments)    Migraine, chest pain  . Toprol Xl [Metoprolol Tartrate] Shortness Of Breath, Swelling and Other (See Comments)    Chest pain  . Advair Diskus [Fluticasone-Salmeterol] Other (See Comments)    Asthma worsens  . Amaryl [Glimepiride] Other (See Comments)    Exacerbation of IBS  . Byetta 10 Mcg Pen [Exenatide] Other (See Comments)    Elevated liver enzymes,  fluid retention  . Bystolic [Nebivolol Hcl] Nausea Only and Other (See Comments)    Headache, dizziness, constipation  . Lovaza [Omega-3-Acid Ethyl Esters] Swelling  . Metformin And Related     Exacerbation of IBS  . Methadone Itching  . Morphine And Related Itching    Pt can tolerate with diphenhydramine  . Topamax [Topiramate] Other (See Comments)     Elevated liver enzymes, tiredness, fluid retention  . Zocor [Simvastatin] Other (See Comments)    Muscle pain  . Amoxicillin Itching and Rash  . Penicillins Itching and Rash    Medications:  Scheduled: . cholecalciferol  1,000 Units Oral QPM  . clopidogrel  75 mg Oral Daily  . cyanocobalamin  500 mcg Oral QPM  . enoxaparin (LOVENOX) injection  40 mg Subcutaneous Q24H  . [START ON 06/28/2017] insulin aspart  0-9 Units Subcutaneous TID WC  . insulin NPH Human  20 Units Subcutaneous BID  . [START ON 06/28/2017] levothyroxine  88 mcg Oral QAC breakfast  . multivitamin with minerals  1 tablet Oral QPM  . potassium chloride  40 mEq Oral BID  . [START ON 06/28/2017] rosuvastatin  10 mg Oral q1800    ROS: Review of Systems: ROS was attempted today and was able to be performed.  Systems assessed include - Constitutional, Eyes, HENT, Respiratory, Cardiovascular, Gastrointestinal, Genitourinary, Integument/breast, Hematologic/lymphatic, Musculoskeletal, Neurological, Behavioral/Psych, Endocrine,  Allergic/Immunologic - the patient complains of only the following symptoms, and all other reviewed systems are negative.   Physical Examination: Blood pressure (!) 107/57, pulse 75, temperature 97.9 F (36.6 C), resp. rate 16, height 5\' 2"  (1.575 m), weight 201 lb 5 oz (91.3 kg), SpO2 100 %.   Temp:  [97.9 F (36.6 C)] 97.9 F (36.6 C) (08/03 1721) Pulse Rate:  [75] 75 (08/03 1717) Resp:  [16] 16 (08/03 1717) BP: (107)/(57) 107/57 (08/03 1717) SpO2:  [100 %] 100 % (08/03 1717) Weight:  [201 lb 5 oz (91.3 kg)] 201 lb 5 oz (91.3 kg) (08/03 1643)  General - Well nourished, well developed, in no apparent distress.  Ophthalmologic - Sharp disc margins OU.   Cardiovascular - Regular rate and rhythm.  Mental Status -  Level of arousal and orientation to time, place, and person were intact. Language including expression, naming, repetition, comprehension was assessed and found intact, mild  dysarthria. Attention span and concentration were normal. Fund of Knowledge was assessed and was intact.  Cranial Nerves II - XII - II - Visual field intact OU, however left simultanagnosia. III, IV, VI - Extraocular movements intact. V - Facial sensation intact bilaterally. VII - left facial droop. VIII - Hearing & vestibular intact bilaterally. X - Palate elevates symmetrically, mild dysarthria. XI - Chin turning & shoulder shrug intact bilaterally. XII - Tongue protrusion intact.  Motor Strength - The patient's strength was 5/5 RUE and RLE, 3+/5 LUE proximal, 3-/5 wrist extension, 0/5 finger movement, 4+/5 LLE proximal and distally.  Bulk was normal and fasciculations were absent.   Motor Tone - Muscle tone was assessed at the neck and appendages and was normal.  Reflexes - The patient's reflexes were 1+ in all extremities and she had no pathological reflexes.  Sensory - Light touch, temperature/pinprick were assessed and were symmetrical.    Coordination - The patient had normal movements in the right hand with no ataxia or dysmetria.  Tremor was absent.  Gait and Station - deferred.  NIH Stroke Scale  Level Of Consciousness 0=Alert; keenly responsive 1=Not alert, but arousable by  minor stimulation 2=Not alert, requires repeated stimulation 3=Responds only with reflex movements 0  LOC Questions to Month and Age 62=Answers both questions correctly 1=Answers one question correctly 2=Answers neither question correctly 0  LOC Commands      -Open/Close eyes     -Open/close grip 0=Performs both tasks correctly 1=Performs one task correctly 2=Performs neighter task correctly 0  Best Gaze 0=Normal 1=Partial gaze palsy 2=Forced deviation, or total gaze paresis 0  Visual 0=No visual loss 1=Partial hemianopia 2=Complete hemianopia 3=Bilateral hemianopia (blind including cortical blindness) 0  Facial Palsy 0=Normal symmetrical movement 1=Minor paralysis (asymmetry) 2=Partial  paralysis (lower face) 3=Complete paralysis (upper and lower face) 2  Motor  0=No drift, limb holds posture for full 10 seconds 1=Drift, limb holds posture, no drift to bed 2=Some antigravity effort, cannot maintain posture, drifts to bed 3=No effort against gravity, limb falls 4=No movement Right Arm 0     Leg 0    Left Arm 2     Leg 1  Limb Ataxia 0=Absent 1=Present in one limb 2=Present in two limbs 0  Sensory 0=Normal 1=Mild to moderate sensory loss 2=Severe to total sensory loss 0  Best Language 0=No aphasia, normal 1=Mild to moderate aphasia 2=Mute, global aphasia 3=Mute, global aphasia 0  Dysarthria 0=Normal 1=Mild to moderate 2=Severe, unintelligible or mute/anarthric 1  Extinction/Neglect 0=No abnormality 1=Extinction to bilateral simultaneous stimulation 2=Profound neglect 1  Total   7      Results for orders placed or performed during the hospital encounter of 06/27/17 (from the past 48 hour(s))  I-stat troponin, ED     Status: None   Collection Time: 06/27/17  4:37 PM  Result Value Ref Range   Troponin i, poc 0.00 0.00 - 0.08 ng/mL   Comment 3            Comment: Due to the release kinetics of cTnI, a negative result within the first hours of the onset of symptoms does not rule out myocardial infarction with certainty. If myocardial infarction is still suspected, repeat the test at appropriate intervals.   I-Stat Chem 8, ED     Status: Abnormal   Collection Time: 06/27/17  4:38 PM  Result Value Ref Range   Sodium 139 135 - 145 mmol/L   Potassium 2.3 (LL) 3.5 - 5.1 mmol/L   Chloride 93 (L) 101 - 111 mmol/L   BUN 21 (H) 6 - 20 mg/dL   Creatinine, Ser 1.471.50 (H) 0.44 - 1.00 mg/dL   Glucose, Bld 829139 (H) 65 - 99 mg/dL   Calcium, Ion 5.620.99 (L) 1.15 - 1.40 mmol/L   TCO2 30 0 - 100 mmol/L   Hemoglobin 11.9 (L) 12.0 - 15.0 g/dL   HCT 13.035.0 (L) 86.536.0 - 78.446.0 %   Comment NOTIFIED PHYSICIAN   APTT     Status: None   Collection Time: 06/27/17  5:32 PM  Result  Value Ref Range   aPTT 28 24 - 36 seconds  Protime-INR     Status: None   Collection Time: 06/27/17  5:32 PM  Result Value Ref Range   Prothrombin Time 13.2 11.4 - 15.2 seconds   INR 1.00    Ct Head Code Stroke Wo Contrast  Result Date: 06/27/2017 CLINICAL DATA:  Code stroke. 46 year old female last seen normal 1520 hours. Left side weakness. New rounded fairly circumscribed area of hypodensity along the anterior right operculum EXAM: CT HEAD WITHOUT CONTRAST TECHNIQUE: Contiguous axial images were obtained from the base of the skull through the vertex without  intravenous contrast. COMPARISON:  Head CT without contrast 05/31/2017. FINDINGS: Brain: 3 areas of abnormal hypodensity are identified in the right MCA territory including the inferior insula/operculum (series 3, image 14), right corona radiata (image 21), and right parietal lobe (image 20). Of these, only the insula, operculum lesion was not evident on 05/31/2017, but all 3 areas have similar density suggesting subacute to chronic time course. Mild ex vacuo enlargement of the right lateral ventricle. No acute intracranial hemorrhage identified. No midline shift, mass effect, or evidence of intracranial mass lesion. No ventriculomegaly. No definite acute cortically based infarct identified. Vascular: Mild Calcified atherosclerosis at the skull base. No suspicious intracranial vascular hyperdensity. Skull: Stable. Hyperostosis of the calvarium. No acute osseous abnormality identified. Sinuses/Orbits: Visualized paranasal sinuses and mastoids are stable and well pneumatized. Other: No acute orbit or scalp soft tissue findings. ASPECTS [facility] Stroke Program Early CT Score) Total score (0-10 with 10 being normal): 10 IMPRESSION: 1. Three areas of subacute to chronic appearing ischemia in the right MCA territory, most of which were evident on the 05/31/2017 prior. No definite acute cortically based infarct and no acute intracranial hemorrhage  identified. 2. ASPECTS is 10. 3. Study discussed by telephone with Dr. Roda Shutters On 06/27/2017 at 16:44 . Electronically Signed   By: Odessa Fleming M.D.   On: 06/27/2017 16:48   MRI limited brain pending   Assessment: 46 y.o. 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 Apparently, she had a near syncope and worsening of left-sided weakness. However after fluid resuscitation, blood pressure improved and she back to her baseline. Due to her recent right MCA stroke with right M1 stenosis and hypotension, we will need to have limited MRI brain to rule out any new stroke. Potassium was low, currently getting supplement. Recommend to admit to internal medicine for further management. Stroke team will follow.  Stroke Risk Factors - diabetes mellitus, hyperlipidemia and hypertension  Plan: 1. HgbA1c, fasting lipid panel 2. MRI limited brain without contrast to rule out any new stroke. Patient had external heart monitoring, need to be removed for MRI, and replaced back after MRI. 3. PT consult, OT consult, Speech consult 4. Echocardiogram 5. Continue Plavix and Crestor for stroke prevention. Patient had aspirin allergy 6. Continue follow-up with her neurologist Dr. Algis Greenhouse in [facility] health care after discharge 7. Risk factor modification 8. Telemetry monitoring 9. Frequent neuro checks 10. Potassium supplement 11. Hold off blood pressure medication for now, close BP check. BP goal normotensive. 12. Stroke team will follow.   Marvel Plan, MD PhD Stroke Neurology 06/27/2017, 6:21 PM  This patient is critically ill due to near syncope, recent stroke, stroke like symptoms and at significant risk of neurological worsening, death form recurrent stroke, seizure, syncope. This patient's care requires constant monitoring of vital signs, hemodynamics, respiratory and cardiac monitoring, review  of multiple databases, neurological assessment, discussion with family, other specialists and medical decision making of high complexity. I had long discussion with patient and husband at bedside, updated pt current condition, treatment plan and potential prognosis. They expressed understanding and appreciation. I spent 50 minutes of neurocritical care time in the care of this patient.

## 2017-06-27 NOTE — ED Notes (Signed)
Per Hospitalist, heart monitor can be removed for MRI and placed back on after MRI complete.

## 2017-06-27 NOTE — ED Notes (Signed)
On way to MRI 

## 2017-06-27 NOTE — ED Provider Notes (Signed)
[facility]EMER[facility]ENCY DEPT Provider Note   CSN: [id number] Arrival date & time: 06/27/17  1635   An emergency department physician performed an initial assessment on this suspected stroke patient at 1625. Level V caveat acutely situation History   Chief Complaint Chief Complaint  Patient presents with  . Code Stroke    HPI Suzanne Pugh is a 46 y.o. female.Patient developed headache approximately 3 PM today and had syncopal or near syncopal event. She is presently pain-free and asymptomatic without treatment. Code stroke was called in the field. She presently looks at baseline per her husband who accompanies her. She has no headache and is asymptomatic without treatment.  HPI  Past Medical History:  Diagnosis Date  . Hypertension   . Insulin dependent diabetes mellitus (HCC)   . Peripheral edema   . Pseudotumor cerebri     There are no active problems to display for this patient.   History reviewed. No pertinent surgical history.  OB History    No data available       Home Medications    Prior to Admission medications   Medication Sig Start Date End Date Taking? Authorizing Provider  Biotin (BIOTIN 5000) 5 MG CAPS Take 1 capsule by mouth every evening.    [provider]  calcium-vitamin D (OSCAL WITH D) 500-200 MG-UNIT per tablet Take 1 tablet by mouth every evening.    [provider]  cholecalciferol (VITAMIN D) 1000 UNITS tablet Take 1,000 Units by mouth every evening.    [provider]  cyanocobalamin 500 MCG tablet Take 500 mcg by mouth every evening.    [provider]  furosemide (LASIX) 40 MG tablet Take 40 mg by mouth every morning.    [provider]  insulin glulisine (APIDRA) 100 UNIT/ML injection Inject 5 Units into the skin 3 (three) times daily before meals. For BS above 150    [provider]  insulin NPH (HUMULIN N,NOVOLIN N) 100 UNIT/ML injection Inject 40 Units into the skin 2 (two) times daily.     [provider]  lactase (LACTAID) 3000 UNITS tablet Take 1 tablet by mouth daily as needed (gi upset from diet).    [provider]  levothyroxine (SYNTHROID, LEVOTHROID) 88 MCG tablet Take 88 mcg by mouth daily before breakfast.    [provider]  Multiple Vitamin (MULTIVITAMIN WITH MINERALS) TABS Take 1 tablet by mouth every evening.    [provider]  OVER THE COUNTER MEDICATION Take 1 capsule by mouth every evening. Liver Tonic supplement    [provider]  OVER THE COUNTER MEDICATION Take 1 capsule by mouth every evening. Respirtonic supplement    [provider]  potassium chloride (K-DUR,KLOR-CON) 10 MEQ tablet Take 20 mEq by mouth 2 (two) times daily.    [provider]  psyllium (REGULOID) 0.52 G capsule Take 3 capsules by mouth every evening.    [provider]  saccharomyces boulardii (FLORASTOR) 250 MG capsule Take 250 mg by mouth every evening.    [provider]  Turmeric 450 MG CAPS Take 1 capsule by mouth daily as needed (pain).    [provider]  vitamin C (ASCORBIC ACID) 500 MG tablet Take 500 mg by mouth every evening.    [provider]  zinc gluconate 50 MG tablet Take 50 mg by mouth every evening.    [provider]    Family History History reviewed. No pertinent family history.  Social History Social History  Substance Use Topics  .  Smoking status: Never Smoker  . Smokeless tobacco: Not on file  . Alcohol use No     Allergies   Calcium channel blockers; Dilaudid [hydromorphone hcl]; Hyzaar [losartan potassium-hctz]; Imitrex [sumatriptan]; Latex; Micardis [telmisartan]; Norvasc [amlodipine besylate]; Percocet [oxycodone-acetaminophen]; Procardia [nifedipine]; Toprol xl [metoprolol tartrate]; Advair diskus [fluticasone-salmeterol]; Amaryl [glimepiride]; Byetta 10 mcg pen [exenatide]; Bystolic [nebivolol hcl]; Lovaza [omega-3-acid ethyl esters]; Metformin  and related; Methadone; Morphine and related; Topamax [topiramate]; Zocor [simvastatin]; Amoxicillin; and Penicillins   Review of Systems Review of Systems  Unable to perform ROS: Acuity of condition  Neurological: Positive for weakness and headaches.       Chronic weakness of left arm and left leg chronic left facial droop. Chronic slurred speech     Physical Exam Updated Vital Signs Ht 5\' 2"  (1.575 m)   Wt 91.3 kg (201 lb 5 oz)   BMI 36.82 kg/m   Physical Exam  Constitutional: She is oriented to person, place, and time. She appears well-developed and well-nourished.  HENT:  Head: Normocephalic and atraumatic.  Eyes: Pupils are equal, round, and reactive to light. Conjunctivae are normal.  Neck: Neck supple. No tracheal deviation present. No thyromegaly present.  Cardiovascular: Normal rate and regular rhythm.   No murmur heard. Pulmonary/Chest: Effort normal and breath sounds normal.  Abdominal: Soft. Bowel sounds are normal. She exhibits no distension. There is no tenderness.  Musculoskeletal: Normal range of motion. She exhibits no edema or tenderness.  Neurological: She is alert and oriented to person, place, and time. Coordination normal.  Cranial central nerve VII deficit other cranial nerves II through XII grossly intact moves all extremities motor strength left upper extremity 4/5 left lower extremity 4/5 right upper extremity 5 over 5 right lower extremity 5 over 5  Skin: Skin is warm and dry. No rash noted.  Psychiatric: She has a normal mood and affect.  Nursing note and vitals reviewed.    ED Treatments / Results  Labs (all labs ordered are listed, but only abnormal results are displayed) Labs Reviewed  I-STAT CHEM 8, ED - Abnormal; Notable for the following:       Result Value   Potassium 2.3 (*)    Chloride 93 (*)    BUN 21 (*)    Creatinine, Ser 1.50 (*)    Glucose, Bld 139 (*)    Calcium, Ion 0.99 (*)    Hemoglobin 11.9 (*)    HCT 35.0 (*)    All  other components within normal limits  I-STAT TROPONIN, ED  CBG MONITORING, ED    EKG  EKG Interpretation None     ED ECG REPORT   Date: 06/27/2017  Rate: 75  Rhythm: normal sinus rhythm  QRS Axis: normal  Intervals: QT prolonged  ST/T Wave abnormalities: nonspecific T wave changes  Conduction Disutrbances:none  Narrative Interpretation:   Old EKG Reviewed: Inferolateral T-wave changes consistent with hypokalemia and recent previous tracing  I have personally reviewed the EKG tracing and agree with the computerized printout as noted.  Radiology Ct Head Code Stroke Wo Contrast  Result Date: 06/27/2017 CLINICAL DATA:  Code stroke. 46 year old female last seen normal 1520 hours. Left side weakness. New rounded fairly circumscribed area of hypodensity along the anterior right operculum EXAM: CT HEAD WITHOUT CONTRAST TECHNIQUE: Contiguous axial images were obtained from the base of the skull through the vertex without intravenous contrast. COMPARISON:  Head CT without contrast 05/31/2017. FINDINGS: Brain: 3 areas of abnormal hypodensity are identified in the right MCA territory including the  inferior insula/operculum (series 3, image 14), right corona radiata (image 21), and right parietal lobe (image 20). Of these, only the insula, operculum lesion was not evident on 05/31/2017, but all 3 areas have similar density suggesting subacute to chronic time course. Mild ex vacuo enlargement of the right lateral ventricle. No acute intracranial hemorrhage identified. No midline shift, mass effect, or evidence of intracranial mass lesion. No ventriculomegaly. No definite acute cortically based infarct identified. Vascular: Mild Calcified atherosclerosis at the skull base. No suspicious intracranial vascular hyperdensity. Skull: Stable. Hyperostosis of the calvarium. No acute osseous abnormality identified. Sinuses/Orbits: Visualized paranasal sinuses and mastoids are stable and well pneumatized. Other:  No acute orbit or scalp soft tissue findings. ASPECTS [facility] Stroke Program Early CT Score) Total score (0-10 with 10 being normal): 10 IMPRESSION: 1. Three areas of subacute to chronic appearing ischemia in the right MCA territory, most of which were evident on the 05/31/2017 prior. No definite acute cortically based infarct and no acute intracranial hemorrhage identified. 2. ASPECTS is 10. 3. Study discussed by telephone with Dr. Roda Shutters On 06/27/2017 at 16:44 . Electronically Signed   By: Odessa Fleming M.D.   On: 06/27/2017 16:48    Procedures Procedures (including critical care time)  Medications Ordered in ED Medications  sodium chloride 0.9 % bolus 1,000 mL (1,000 mLs Intravenous New Bag/Given 06/27/17 1653)   NIH stroke scale calculated at 8 by stroke team.dR xu, neurologist evaluate patient in ED and recommends admission to hospitalist service Results for orders placed or performed during the hospital encounter of 06/27/17  APTT  Result Value Ref Range   aPTT 28 24 - 36 seconds  Protime-INR  Result Value Ref Range   Prothrombin Time 13.2 11.4 - 15.2 seconds   INR 1.00   I-stat troponin, ED  Result Value Ref Range   Troponin i, poc 0.00 0.00 - 0.08 ng/mL   Comment 3          I-Stat Chem 8, ED  Result Value Ref Range   Sodium 139 135 - 145 mmol/L   Potassium 2.3 (LL) 3.5 - 5.1 mmol/L   Chloride 93 (L) 101 - 111 mmol/L   BUN 21 (H) 6 - 20 mg/dL   Creatinine, Ser 1.61 (H) 0.44 - 1.00 mg/dL   Glucose, Bld 096 (H) 65 - 99 mg/dL   Calcium, Ion 0.45 (L) 1.15 - 1.40 mmol/L   TCO2 30 0 - 100 mmol/L   Hemoglobin 11.9 (L) 12.0 - 15.0 g/dL   HCT 40.9 (L) 81.1 - 91.4 %   Comment NOTIFIED PHYSICIAN    Ct Head Code Stroke Wo Contrast  Result Date: 06/27/2017 CLINICAL DATA:  Code stroke. 46 year old female last seen normal 1520 hours. Left side weakness. New rounded fairly circumscribed area of hypodensity along the anterior right operculum EXAM: CT HEAD WITHOUT CONTRAST TECHNIQUE: Contiguous axial  images were obtained from the base of the skull through the vertex without intravenous contrast. COMPARISON:  Head CT without contrast 05/31/2017. FINDINGS: Brain: 3 areas of abnormal hypodensity are identified in the right MCA territory including the inferior insula/operculum (series 3, image 14), right corona radiata (image 21), and right parietal lobe (image 20). Of these, only the insula, operculum lesion was not evident on 05/31/2017, but all 3 areas have similar density suggesting subacute to chronic time course. Mild ex vacuo enlargement of the right lateral ventricle. No acute intracranial hemorrhage identified. No midline shift, mass effect, or evidence of intracranial mass lesion. No ventriculomegaly. No definite acute  cortically based infarct identified. Vascular: Mild Calcified atherosclerosis at the skull base. No suspicious intracranial vascular hyperdensity. Skull: Stable. Hyperostosis of the calvarium. No acute osseous abnormality identified. Sinuses/Orbits: Visualized paranasal sinuses and mastoids are stable and well pneumatized. Other: No acute orbit or scalp soft tissue findings. ASPECTS [facility] Stroke Program Early CT Score) Total score (0-10 with 10 being normal): 10 IMPRESSION: 1. Three areas of subacute to chronic appearing ischemia in the right MCA territory, most of which were evident on the 05/31/2017 prior. No definite acute cortically based infarct and no acute intracranial hemorrhage identified. 2. ASPECTS is 10. 3. Study discussed by telephone with Dr. Roda Shutters On 06/27/2017 at 16:44 . Electronically Signed   By: Odessa Fleming M.D.   On: 06/27/2017 16:48   Initial Impression / Assessment and Plan / ED Course  I have reviewed the triage vital signs and the nursing notes.  Pertinent labs & imaging results that were available during my care of the patient were reviewed by me and considered in my medical decision making (see chart for details).     Hospitalist service consulted and arranged to  see patient in ED and arrange for admission. IV potassium supplementation ordered. 6:15 PM patient sleeping easily arousable. Asymptomatic. Looks at baseline per her husband Final Clinical Impressions(s) / ED Diagnoses  Diagnosis #1 syncope #2 headache #3 hypokalemia Final diagnoses:  None  CRITICAL CARE Performed by: Doug Sou Total critical care time: 30 minutes Critical care time was exclusive of separately billable procedures and treating other patients. Critical care was necessary to treat or prevent imminent or life-threatening deterioration. Critical care was time spent personally by me on the following activities: development of treatment plan with patient and/or surrogate as well as nursing, discussions with consultants, evaluation of patient's response to treatment, examination of patient, obtaining history from patient or surrogate, ordering and performing treatments and interventions, ordering and review of laboratory studies, ordering and review of radiographic studies, pulse oximetry and re-evaluation of patient's condition.  New Prescriptions New Prescriptions   No medications on file     Doug Sou, MD 06/27/17 [phone]

## 2017-06-27 NOTE — ED Triage Notes (Signed)
Per EMS, patient LSN 1520.  [place] and patient started feeling nauseated and having headache.  Complaining of left sided weakness.  Pt's husband called 911.  Symptoms subsided within 10 minutes of EMS arrival.  Patient's husband now states that she is at baseline.  EMS recorded 70 systolic, started IV, gave 500 bolus.  BP meds changed recently.  Clonidine patch removed by this nurse.

## 2017-06-28 ENCOUNTER — Observation Stay (HOSPITAL_BASED_OUTPATIENT_CLINIC_OR_DEPARTMENT_OTHER): Payer: BLUE CROSS/BLUE SHIELD

## 2017-06-28 ENCOUNTER — Observation Stay (HOSPITAL_COMMUNITY): Payer: BLUE CROSS/BLUE SHIELD

## 2017-06-28 DIAGNOSIS — I340 Nonrheumatic mitral (valve) insufficiency: Secondary | ICD-10-CM | POA: Diagnosis not present

## 2017-06-28 DIAGNOSIS — R55 Syncope and collapse: Secondary | ICD-10-CM | POA: Diagnosis not present

## 2017-06-28 DIAGNOSIS — E876 Hypokalemia: Secondary | ICD-10-CM | POA: Diagnosis not present

## 2017-06-28 DIAGNOSIS — I959 Hypotension, unspecified: Secondary | ICD-10-CM | POA: Diagnosis not present

## 2017-06-28 LAB — HEPATIC FUNCTION PANEL
ALK PHOS: 86 U/L (ref 38–126)
ALT: 38 U/L (ref 14–54)
AST: 31 U/L (ref 15–41)
Albumin: 3 g/dL — ABNORMAL LOW (ref 3.5–5.0)
Bilirubin, Direct: <0.1 mg/dL — ABNORMAL LOW (ref 0.1–0.5)
TOTAL PROTEIN: 6.6 g/dL (ref 6.5–8.1)
Total Bilirubin: 0.5 mg/dL (ref 0.3–1.2)

## 2017-06-28 LAB — CBC
HEMATOCRIT: 34.3 % — AB (ref 36.0–46.0)
HEMOGLOBIN: 10.9 g/dL — AB (ref 12.0–15.0)
MCH: 27.7 pg (ref 26.0–34.0)
MCHC: 31.8 g/dL (ref 30.0–36.0)
MCV: 87.1 fL (ref 78.0–100.0)
PLATELETS: 361 10*3/uL (ref 150–400)
RBC: 3.94 MIL/uL (ref 3.87–5.11)
RDW: 14.7 % (ref 11.5–15.5)
WBC: 12.3 10*3/uL — AB (ref 4.0–10.5)

## 2017-06-28 LAB — ECHOCARDIOGRAM COMPLETE
CHL CUP RV SYS PRESS: 19 mmHg
E/e' ratio: 8.7
EWDT: 208 ms
FS: 38 % (ref 28–44)
HEIGHTINCHES: 62 in
IVS/LV PW RATIO, ED: 0.92
LA ID, A-P, ES: 34 mm
LA diam index: 1.78 cm/m2
LA vol A4C: 30.2 ml
LA vol index: 16.4 mL/m2
LA vol: 31.3 mL
LEFT ATRIUM END SYS DIAM: 34 mm
LV E/e' medial: 8.7
LV E/e'average: 8.7
LV PW d: 12 mm — AB (ref 0.6–1.1)
LV TDI E'MEDIAL: 10.9
LV e' LATERAL: 12.3 cm/s
LVOT area: 2.54 cm2
LVOT diameter: 18 mm
MV Dec: 208
MV pk A vel: 96.7 m/s
MV pk E vel: 107 m/s
MVPG: 5 mmHg
RV LATERAL S' VELOCITY: 13.7 cm/s
Reg peak vel: 200 cm/s
TAPSE: 24.3 mm
TDI e' lateral: 12.3
TRMAXVEL: 200 cm/s
WEIGHTICAEL: 3203.2 [oz_av]

## 2017-06-28 LAB — URINALYSIS, ROUTINE W REFLEX MICROSCOPIC
BILIRUBIN URINE: NEGATIVE
Glucose, UA: NEGATIVE mg/dL
HGB URINE DIPSTICK: NEGATIVE
KETONES UR: NEGATIVE mg/dL
Leukocytes, UA: NEGATIVE
NITRITE: NEGATIVE
PROTEIN: NEGATIVE mg/dL
SPECIFIC GRAVITY, URINE: 1.016 (ref 1.005–1.030)
pH: 7 (ref 5.0–8.0)

## 2017-06-28 LAB — LIPID PANEL
Cholesterol: 131 mg/dL (ref 0–200)
HDL: 29 mg/dL — AB (ref >40)
LDL Cholesterol: 67 mg/dL (ref 0–99)
TRIGLYCERIDES: 173 mg/dL — AB (ref <150)
Total CHOL/HDL Ratio: 4.5 RATIO
VLDL: 35 mg/dL (ref 0–40)

## 2017-06-28 LAB — GLUCOSE, CAPILLARY
GLUCOSE-CAPILLARY: 161 mg/dL — AB (ref 65–99)
GLUCOSE-CAPILLARY: 135 mg/dL — AB (ref 65–99)
Glucose-Capillary: 152 mg/dL — ABNORMAL HIGH (ref 65–99)
Glucose-Capillary: 214 mg/dL — ABNORMAL HIGH (ref 65–99)

## 2017-06-28 LAB — BASIC METABOLIC PANEL
ANION GAP: 9 (ref 5–15)
BUN: 18 mg/dL (ref 6–20)
CHLORIDE: 102 mmol/L (ref 101–111)
CO2: 27 mmol/L (ref 22–32)
Calcium: 8.3 mg/dL — ABNORMAL LOW (ref 8.9–10.3)
Creatinine, Ser: 1.02 mg/dL — ABNORMAL HIGH (ref 0.44–1.00)
GFR calc Af Amer: >60 mL/min (ref >60)
GLUCOSE: 159 mg/dL — AB (ref 65–99)
POTASSIUM: 2.3 mmol/L — AB (ref 3.5–5.1)
SODIUM: 138 mmol/L (ref 135–145)

## 2017-06-28 LAB — NA AND K (SODIUM & POTASSIUM), RAND UR
POTASSIUM UR: 28 mmol/L
SODIUM UR: 116 mmol/L

## 2017-06-28 LAB — MAGNESIUM
MAGNESIUM: 2 mg/dL (ref 1.7–2.4)
MAGNESIUM: 2.1 mg/dL (ref 1.7–2.4)

## 2017-06-28 MED ORDER — DIPHENHYDRAMINE HCL 25 MG PO CAPS
25.0000 mg | ORAL_CAPSULE | Freq: Every evening | ORAL | Status: DC | PRN
  Administered 2017-06-28: 25 mg via ORAL
  Filled 2017-06-28: qty 1

## 2017-06-28 MED ORDER — NON FORMULARY: 100.0000 mg | Freq: Every day | Status: DC

## 2017-06-28 MED ORDER — CALCIUM CARBONATE ANTACID 500 MG PO CHEW
400.0000 mg | CHEWABLE_TABLET | Freq: Three times a day (TID) | ORAL | Status: DC | PRN
  Administered 2017-06-28: 400 mg via ORAL
  Filled 2017-06-28 (×3): qty 2

## 2017-06-28 MED ORDER — POTASSIUM CHLORIDE 10 MEQ/100ML IV SOLN
10.0000 meq | INTRAVENOUS | Status: AC
  Administered 2017-06-28 (×5): 10 meq via INTRAVENOUS
  Filled 2017-06-28 (×5): qty 100

## 2017-06-28 MED ORDER — ROSUVASTATIN CALCIUM 40 MG PO TABS
40.0000 mg | ORAL_TABLET | Freq: Every day | ORAL | Status: DC
  Administered 2017-06-28 – 2017-06-29 (×2): 40 mg via ORAL
  Filled 2017-06-28: qty 1

## 2017-06-28 NOTE — Plan of Care (Signed)
Problem: Bowel/Gastric: Goal: Will not experience complications related to bowel motility Outcome: Not Progressing Patient states LBM "maybe 5-6 days ago." Bowel sounds active. RN will ncontinue to monitor.

## 2017-06-28 NOTE — Progress Notes (Signed)
Patient states she is not able to take PO potassium due to swelling and difficulty breathing. MD paged. STAT BMET and magnesium lab order per Dr Toniann FailKakrakandy. RN will continue to monitor patient.

## 2017-06-28 NOTE — Progress Notes (Addendum)
Patient critical K+ level 2.3. Patients magnesium level 2.0. MD notified. Patient HR 58-60. RN will continue to monitor.

## 2017-06-28 NOTE — Progress Notes (Signed)
Subjective: [name] was in bed on my visit. She feels better than when she arrived and states that her arm is as weak as it was yesterday. She states that on Tuesday and Thursday, she felt as though her left arm and hand had gotten progressively worse since the stroke.   Aspirin allergy is shortness of breath and chest pain.  Current Pertinent Medications: Plavix 75mg  daily Lovenox 40mg  daily Crestor 10mg  daily Synthroid 88mcg SSI  Pertinent Labs/Diagnostics: CT head 06/27/17: 3 areas of abnormal hypodensity are identified in the right MCA territory including the inferior insula/operculum, right corona radiata, and right parietal lobe. Of these, only the insula, operculum lesion was not evident on 05/31/2017, but all 3 areas have similar density suggesting subacute to chronic time course. MRI limited 06/27/17: Right frontal periventricular and right anterior insula early subacute infarcts with mild reduced diffusion. There are additional scattered foci of reduced diffusion in the cortex of right MCA distribution best appreciated in the insula likely representing additional areas of subacute infarction.  Physical Examination: Vitals:   06/28/17 0332 06/28/17 0844  BP: 121/60 121/64  Pulse: (!) 59 67  Resp: 18 20  Temp:  98.4 F (36.9 C)    General: WDWN female.  HEENT:  Normocephalic, no lesions, without obvious abnormality.  Normal external eye and conjunctiva.  Normal external ears. Normal external nose, mucus membranes and septum.  Normal pharynx. Cardiovascular: S1, S2 normal, pulses palpable throughout   Pulmonary: Chest clear, unlabored breathing Abdomen: Soft, non-tender Extremities: Some left arm/hand swelling Musculoskeletal: Tone and bulk normal throughout; no atrophy noted  Neurological Examination:  CN: Pupils are equal, round and symmetrically reactive from 3-->2 mm. EOMI without nystagmus. Left simultagnosia. Facial sensation is intact to light touch. Face with left  droop. Hearing is intact to conversational voice. Palate elevates symmetrically and uvula is midline. Voice is normal in tone, pitch and quality. Bilateral SCM and trapezii are 5/5. Tongue is midline with normal bulk and mobility.  Motor: Normal bulk, tone, and strength. 5/5 throughout RUE and RLE, 3+/5 LUE proximally and 1-2/5 left hand grip 0/5 left finger movement, 4/5 LLE. No drift.  Sensation: Intact to light touch.  DTRs: 1+, symmetric  Toes downgoing bilaterally. Coordination: Finger-to-nose without ataxia on the right. Unable to perform on the left.  Assessment:  This 46 year old [facility] following an episode of headache, dizziness, diaphoresis, slurred speech, unresponsiveness and worsening left sided weakness. CT reported subacute area of abnormal hypodensity, particularly in the inferior insula/operculum that was not evident on 05/31/2017, while MRI showed right frontal periventricular and right anterior insula early subacute infarcts with mild reduced diffusion as well as additional areas of subacute infarct. Notably, [facility] stroke on 05/31/17 showed a stenotic right M1, and these lesions are in the MCA distribution. These lesions may correspond with the progressive worsening of her left arm and hand weakness on Tuesday and Thursday of last week that Suzanne Pugh reported. There is no evidence of a new infarct at this time. As she arrived with a very low blood pressure, it is most likely that the event that brought her into the ED was due to hypotension.  Recommendations: 1) Optimize medical management:        - 40mg  Crestor daily + 100mg  Co Q10        - Aggressive blood pressure management        - Aggressive blood sugar management - SSI        -  Continue 75mg  Plavix for now. Ideally, she would be on dual antiplatelet therapy.         - PT/OT/Speech therapy strongly recommended 2) Correction of metabolic derangements per primary  team    Bruna PotterJamie Aldridge PA-C [facility] [phone]  06/28/2017, 9:37 AM    NEUROHOSPITALIST ADDENDUM Seen and examined the patient this AM. Formulated plan as documented above.  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 It appears or last week she's had worsening of her left arm and hand weakness corresponding to new areas of subacute infarcts on her MRI ( while comparing reports of [facility]/23 by her neurologist).  The event that occurred yesterday as likely from hypotension and she clinically improved after her blood pressure returned to normal tension.  She is a high-risk case given recurrent strokes in the setting of stenotic right M1 likely due to ICAD. From the results of the SAMMPRIS trial, maximum medical therapy which included dual antiplatelets, high-dose statin, tight blood pressure management, and exercise and dietary and lifestyle modification beat endovascular treatment with intracranial stenting.  However this patient is not optically medically managed. She is only on low-dose of Crestor 10 mg due to history of cramps with statin use.  She is only on a single antiplatelet due to questionable history of aspirin allergy. She continues to have fluctuations in her blood pressure She does not exercise- however is currently receiving physical therapy. She has poor diet.  Recommendations *Crestor and switch her to Lipitor 40 mg daily. Coenzyme Q 10. Obtain LFTs for baseline and recheck LFTs in 1 month. * Was discuss whether we should start her on AGGRENOX in addition to her Plavix. Aggrenox discontinued 25 mg aspirin.  Would not recommend Brilinta as it is also PY 12 inhibitor like  Plavix.    Georgiana SpinnerSushanth Aroor MD [facility] [phone]  If 7pm to 7am, please call on call as listed on AMION.

## 2017-06-28 NOTE — Progress Notes (Signed)
STROKE TEAM PROGRESS NOTE   Attending Addendum:  Acute right frontal corona radiata infarct and right insular/frontal operculum infarcts.  I suspect her initial presentation of eyes open without responsiveness, turning blue, tongue in between teeth, and generalized stiffness was a post-stroke GTCS.   An EEG was never done here, but a good idea to do as outpatient.  MRA Brain and Neck negative in the recent past.  TTE negative in the past.  Telemetry has been NSR so far here.  Suspect cardioembolism.  Recommend TEE and if negative implanted cardiac loop recorder.  These [facility].  For now, continue Plavix and Crestor.  BP is relatively well controlled, as is LDL cholesterol.  Her DM needs to be better optimized.  Non smoker.   Ok to discharge home with close follow up with own outpatient neurologist.    Suzanne SettleShervin Carlyann Placide, MS, MD      HISTORY OF PRESENT ILLNESS (per record) Suzanne Pugh is an 46 y.o. 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 [facility] and MRI showed acute to subacute infarcts in the right frontal and parietal lobes with involvement of the right corona radiata and right centrum semiovale. CTA head and neck cannot be done due to iodine allergy. MRA neck showed no stenosis or thrombosis. MRA head showed right M1 stenosis, at least moderate, and there is a decreased caliber of the distal right MCA branches distal to the stenosis. LDL 143, A1c 9.0. Patient also has allergy to aspirin, was discharged with Plavix and Crestor. On discharge, she still had left facial droop, left hemiparesis.  After discharge, she [facility], managing her blood pressure and sugar control. She followed with neurologist Dr. Algis GreenhouseForbes on 06/25/17, setting up for external heart monitoring for 14 days and sleep study.  Today, she was using  wheelchair at 3:20 PM with her husband, and she felt dizzy and about to pass out. Her husband found her to have slurred speech and worsening left-sided weakness. EMS called. On arrival, her blood pressure 70/40, received fluid bolus, blood pressure up to 112/62. Her CBG 148. She still have a clonidine patch on her chest. With increasing blood pressure, patient and her husband felt her symptoms gradually back to her baseline. Code stroke called. [facility].  Patient has long-term hypertension, not under control, recently follow with PCP and cardiology for BP management, on multiple BP meds as per patient. She has diabetes since age 89, currently on insulin 80 units a day. She is on Crestor for HLD.  LSN: 3:20pm tPA Given: No: Recent stroke, back to baseline after fluid resuscitation.   SUBJECTIVE (INTERVAL HISTORY) Mild headache.   OBJECTIVE Temp:  [97.4 F (36.3 C)-98.8 F (37.1 C)] 98.8 F (37.1 C) (08/05 1014) Pulse Rate:  [69-79] 69 (08/05 1014) Cardiac Rhythm: Normal sinus rhythm (08/05 0700) Resp:  [16-20] 18 (08/05 1014) BP: (124-139)/(54-75) 139/75 (08/05 1014) SpO2:  [99 %-100 %] 100 % (08/05 1014)  CBC:   Recent Labs Lab 06/27/17 1732 06/28/17 0405  WBC 15.1* 12.3*  NEUTROABS 9.3*  --   HGB 11.7* 10.9*  HCT 36.2 34.3*  MCV 87.7 87.1  PLT 428* 361    Basic Metabolic Panel:   Recent Labs Lab 06/27/17 1732 06/28/17 0405 06/29/17 0306  NA 139 138 139  K 2.4* 2.3* 3.0*  CL 96* 102 105  CO2 32 27 26  GLUCOSE 143* 159* 156*  BUN 19 18 14   CREATININE 1.66* 1.02* 0.80  CALCIUM 8.9 8.3* 8.5*  MG 2.1 2.1  2.0  --     Lipid Panel:     Component Value Date/Time   CHOL 131 06/28/2017 0405   TRIG 173 (H) 06/28/2017 0405   HDL 29 (L) 06/28/2017 0405   CHOLHDL 4.5 06/28/2017 0405   VLDL 35 06/28/2017 0405   LDLCALC 67 06/28/2017 0405   HgbA1c:  Lab Results  Component Value Date   HGBA1C 8.8 (H) 06/28/2017    Urine Drug Screen:     Component Value Date/Time   LABOPIA NONE DETECTED 06/25/2013 2343   COCAINSCRNUR NONE DETECTED 06/25/2013 2343   LABBENZ NONE DETECTED 06/25/2013 2343   AMPHETMU NONE DETECTED 06/25/2013 2343   THCU NONE DETECTED 06/25/2013 2343   LABBARB NONE DETECTED 06/25/2013 2343    Alcohol Level     Component Value Date/Time   ETH <11 06/25/2013 2322    IMAGING  Mr Brain Limited Wo Contrast 06/27/2017 1. Right frontal periventricular and right anterior insula early subacute infarcts with mild reduced diffusion. There are additional scattered foci of reduced diffusion in the cortex of right MCA distribution best appreciated in the insula likely representing additional areas of subacute infarction.  2. Right parietal chronic infarction with increased diffusion. Chronic right lentiform nucleus lacunar infarction.  3. No reduced diffusion without CT or T2 FLAIR correlate to suggest acute infarction.     Ct Head Code Stroke Wo Contrast 06/27/2017 1. Three areas of subacute to chronic appearing ischemia in the right MCA territory, most of which were evident on the 05/31/2017 prior. No definite acute cortically based infarct and no acute intracranial hemorrhage identified.  2. ASPECTS is 10.     PHYSICAL EXAM  Left facial droop and dysarthria. LUE 4/5 BLE 5/5 RUE 4/5 EOMI.   Sensory intact.       ASSESSMENT/PLAN [name] is a 46 y.o. female with history of a previous stroke, diabetes mellitus, hypertension, asthma, and pseudotumor cerebri presenting with dizziness and presyncope. She did not receive IV t-PA due to recent stroke and resolution of deficits.  Strokes:  Right frontal periventricular and right anterior insula early subacute infarcts - embolic - source unknown.  Resultant  Left side weakness and probable GTCS at presentation.  CT head - three areas of subacute to chronic appearing ischemia in the right MCA territory  MRI head - Right  frontal periventricular and right anterior insula early subacute infarcts with mild reduced diffusion.   MRA head - not performed  Carotid Doppler - not performed  2D Echo - EF 60-65%. No cardiac source of emboli identified.  LDL - 67  HgbA1c - 8.8  VTE prophylaxis - Lovenox Diet heart healthy/carb modified Room service appropriate? Yes; Fluid consistency: Thin Diet - low sodium heart healthy Diet Carb Modified  clopidogrel 75 mg daily prior to admission, now on clopidogrel 75 mg daily  Patient counseled to be compliant with her antithrombotic medications  Ongoing aggressive stroke risk factor management  Therapy recommendations: Home health PT and OT.  Disposition: Pending  Hypertension  Stable  Permissive hypertension (OK if < 220/120) but gradually normalize in 5-7 days  Long-term BP goal normotensive  Hyperlipidemia  Home meds: Crestor 10 mg daily changed to Crestor 40 mg daily  LDL 67, goal < 70  Now on Crestor 40 mg daily  Continue statin at  discharge  Diabetes  HgbA1c 8.8, goal < 7.0  Uncontrolled  Other Stroke Risk Factors  Obesity, Body mass index is 36.62 kg/m., recommend weight loss, diet and exercise as appropriate   Hx stroke/TIA   Other Active Problems  Potassium 2.3 - supplemented -> 3.0  - continue supplement  Mild leukocytosis  Mild anemia  Hospital day # 0    To contact Stroke Continuity provider, please refer to [email or web address]. After hours, contact General Neurology

## 2017-06-28 NOTE — Progress Notes (Signed)
  Echocardiogram 2D Echocardiogram has been performed.  Delcie RochENNINGTON, Lavanya Roa 06/28/2017, 5:31 PM

## 2017-06-28 NOTE — Progress Notes (Signed)
PT Cancellation Note  Patient Details Name: Suzanne Pugh MRN: [id number] DOB: 08/29/1971   Cancelled Treatment:    Reason Eval/Treat Not Completed: Medical issues which prohibited therapy. Pt currently with K+ at 2.3. Per policy, therapy cut off level is 2.5. PT will f/u this afternoon for evaluation if K+ levels have improved.   Alessandra BevelsJennifer M Burhan Barham 06/28/2017, 8:16 AM

## 2017-06-28 NOTE — Progress Notes (Signed)
PROGRESS NOTE    Suzanne HampshireMisty Pugh  ZOX:[id number] DOB: 12/14/1970 DOA: 06/27/2017 PCP: [name], [name], MD   Brief Narrative: 46 y.o. female with medical history significant of hypertension, insulin-dependent diabetes, pseudotumor cerebri with chronic headache, hypothyroidism, acute right MCA [facility] with residual left-sided weakness and facial drooping, presented with syncopal episode.  Assessment & Plan:   # Syncope in the setting of hypotension: -Patient was found to have systolic blood pressure of 70s by EMS. Antihypertensive medications were recently adjusted including addition of clonidine and up titration of losartan as per patient and her husband. Patient received IV fluid with improvement in blood pressure. Her mental status around baseline. -No orthostatic hypotension. Discontinue IV fluid, patient has good oral intake. -Continue to hold antihypertensive and diuretics. -CT scan of the head consistent with recent stroke. MRI of the brain with no new acute stroke. Neurology consult appreciated.  -Follow-up echocardiogram  #Recent right MCA stroke, subacute: -MRI of the braiconsistent with recent stroke. No new finding.  -Continue Plavix. Crestor chest to 40 mg by neurologist. Continue PT OT evaluation. Recommend follow-up with neurologist outpatient.   #Hypokalemia and acute kidney injury:  -Renal function improved. Hypokalemia persistent. Treated with IV potassium and oral potassium chloride. Encourage oral intake. Repeat lab in the morning  #Diabetes on chronic insulin: Continue lower dose of insulin. Continue sliding scale. Monitor blood sugar level.  #HyperlipidemiaThe dose of Crestor increased to 40 mg.   Active Problems:   Syncope and collapse   Hypotension, unspecified  DVT prophylaxis: Lovenox  Code Status: full code  Family Communication: patient's friend at bedside  Disposition Plan:Likely discharge home in 1-2  days    Consultants:   Neurologist   Procedures: MRI brain. Echo pending  Antimicrobials:None   Subjective: Seen and examined at bedside. No new event. Denied nausea vomiting chest pain shortness of breath headache or dizziness.   Objective: Vitals:   06/27/17 2325 06/28/17 0100 06/28/17 0332 06/28/17 0844  BP: 133/75 121/69 121/60 121/64  Pulse: 70 63 (!) 59 67  Resp:  18 18 20   Temp:    98.4 F (36.9 C)  TempSrc:    Oral  SpO2:  100% 98% 100%  Weight:      Height:        Intake/Output Summary (Last 24 hours) at 06/28/17 1237 Last data filed at 06/28/17 0844  Gross per 24 hour  Intake          1391.66 ml  Output              100 ml  Net          1291.66 ml   Filed Weights   06/27/17 1643 06/27/17 2115  Weight: 91.3 kg (201 lb 5 oz) 90.8 kg (200 lb 3.2 oz)    Examination:  General exam: Appears calm and comfortable  Respiratory system: Clear to auscultation. Respiratory effort normal. No wheezing or crackle Cardiovascular system: S1 & S2 heard, RRR.  No pedal edema. Gastrointestinal system: Abdomen is nondistended, soft and nontender. Normal bowel sounds heard. Central nervous system: Left-sided weakness and left facial droop. Alert awake and oriented. Skin: No rashes, lesions or ulcers Psychiatry: Judgement and insight appear normal. Mood & affect appropriate.     Data Reviewed: I have personally reviewed following labs and imaging studies  CBC:  Recent Labs Lab 06/27/17 1638 06/27/17 1732 06/28/17 0405  WBC  --  15.1* 12.3*  NEUTROABS  --  9.3*  --  HGB 11.9* 11.7* 10.9*  HCT 35.0* 36.2 34.3*  MCV  --  87.7 87.1  PLT  --  428* 361   Basic Metabolic Panel:  Recent Labs Lab 06/27/17 1638 06/27/17 1732 06/28/17 0405  NA 139 139 138  K 2.3* 2.4* 2.3*  CL 93* 96* 102  CO2  --  32 27  GLUCOSE 139* 143* 159*  BUN 21* 19 18  CREATININE 1.50* 1.66* 1.02*  CALCIUM  --  8.9 8.3*  MG  --  2.1 2.1  2.0   GFR: Estimated Creatinine Clearance:  72.2 mL/min (A) (by C-G formula based on SCr of 1.02 mg/dL (H)). Liver Function Tests:  Recent Labs Lab 06/27/17 1732 06/28/17 0405  AST 46* 31  ALT 46 38  ALKPHOS 94 86  BILITOT 0.5 0.5  PROT 7.5 6.6  ALBUMIN 3.4* 3.0*   No results for input(s): LIPASE, AMYLASE in the last 168 hours. No results for input(s): AMMONIA in the last 168 hours. Coagulation Profile:  Recent Labs Lab 06/27/17 1732  INR 1.00   Cardiac Enzymes: No results for input(s): CKTOTAL, CKMB, CKMBINDEX, TROPONINI in the last 168 hours. BNP (last 3 results) No results for input(s): PROBNP in the last 8760 hours. HbA1C: No results for input(s): HGBA1C in the last 72 hours. CBG:  Recent Labs Lab 06/27/17 2140 06/28/17 0608 06/28/17 1104  GLUCAP 114* 152* 161*   Lipid Profile:  Recent Labs  06/28/17 0405  CHOL 131  HDL 29*  LDLCALC 67  TRIG 161173*  CHOLHDL 4.5   Thyroid Function Tests:  Recent Labs  06/27/17 2227  TSH 1.205   Anemia Panel: No results for input(s): VITAMINB12, FOLATE, FERRITIN, TIBC, IRON, RETICCTPCT in the last 72 hours. Sepsis Labs: No results for input(s): PROCALCITON, LATICACIDVEN in the last 168 hours.  No results found for this or any previous visit (from the past 240 hour(s)).       Radiology Studies: Mr Brain Limited Wo Contrast  Result Date: 06/27/2017 CLINICAL DATA:  46 y/o  F; stroke for follow-up. EXAM: MRI HEAD WITHOUT CONTRAST TECHNIQUE: Axial DWI, coronal DWI, axial T2 FLAIR propeller sequences were acquired. COMPARISON:  06/27/2017 CT head.  05/31/2017 CT head. FINDINGS: Brain: Right frontal periventricular and right anterior insula infarcts demonstrate mild reduced diffusion and were visible on prior CT compatible with early subacute infarction. The lesion in the right parietal lobe demonstrates increased diffusion and is likely a chronic infarction. Additionally, there are a few scattered punctate foci of reduced diffusion within the cortex of the right  MCA distribution also likely representing areas of subacute infarction best appreciated in the right insula. Areas of infarction demonstrate increased T2 FLAIR hyperintense signal abnormality, mildly increased in the subacute and brightly increased in the chronic areas of infarction. There is a chronic hemosiderin stained lacunar infarction within the right lentiform nucleus. No definite evidence for intracranial hemorrhage at this time. No significant mass effect. IMPRESSION: 1. Right frontal periventricular and right anterior insula early subacute infarcts with mild reduced diffusion. There are additional scattered foci of reduced diffusion in the cortex of right MCA distribution best appreciated in the insula likely representing additional areas of subacute infarction. 2. Right parietal chronic infarction with increased diffusion. Chronic right lentiform nucleus lacunar infarction. 3. No reduced diffusion without CT or T2 FLAIR correlate to suggest acute infarction. Electronically Signed   By: Mitzi HansenLance  Furusawa-Stratton M.D.   On: 06/27/2017 20:44   Ct Head Code Stroke Wo Contrast  Result Date: 06/27/2017 CLINICAL DATA:  Code stroke. 46 year old female last seen normal 1520 hours. Left side weakness. New rounded fairly circumscribed area of hypodensity along the anterior right operculum EXAM: CT HEAD WITHOUT CONTRAST TECHNIQUE: Contiguous axial images were obtained from the base of the skull through the vertex without intravenous contrast. COMPARISON:  Head CT without contrast 05/31/2017. FINDINGS: Brain: 3 areas of abnormal hypodensity are identified in the right MCA territory including the inferior insula/operculum (series 3, image 14), right corona radiata (image 21), and right parietal lobe (image 20). Of these, only the insula, operculum lesion was not evident on 05/31/2017, but all 3 areas have similar density suggesting subacute to chronic time course. Mild ex vacuo enlargement of the right lateral  ventricle. No acute intracranial hemorrhage identified. No midline shift, mass effect, or evidence of intracranial mass lesion. No ventriculomegaly. No definite acute cortically based infarct identified. Vascular: Mild Calcified atherosclerosis at the skull base. No suspicious intracranial vascular hyperdensity. Skull: Stable. Hyperostosis of the calvarium. No acute osseous abnormality identified. Sinuses/Orbits: Visualized paranasal sinuses and mastoids are stable and well pneumatized. Other: No acute orbit or scalp soft tissue findings. ASPECTS [facility] Stroke Program Early CT Score) Total score (0-10 with 10 being normal): 10 IMPRESSION: 1. Three areas of subacute to chronic appearing ischemia in the right MCA territory, most of which were evident on the 05/31/2017 prior. No definite acute cortically based infarct and no acute intracranial hemorrhage identified. 2. ASPECTS is 10. 3. Study discussed by telephone with Dr. Roda Shutters On 06/27/2017 at 16:44 . Electronically Signed   By: Odessa Fleming M.D.   On: 06/27/2017 16:48        Scheduled Meds: . cholecalciferol  1,000 Units Oral QPM  . clopidogrel  75 mg Oral Daily  . enoxaparin (LOVENOX) injection  40 mg Subcutaneous Q24H  . insulin aspart  0-9 Units Subcutaneous TID WC  . insulin aspart protamine- aspart  40 Units Subcutaneous BID WC  . levothyroxine  88 mcg Oral QAC breakfast  . multivitamin with minerals  1 tablet Oral QPM  . potassium chloride  40 mEq Oral BID  . rosuvastatin  40 mg Oral Daily   Continuous Infusions:   LOS: 0 days    Dron Jaynie Collins, MD [facility] Pager [phone]  If 7PM-7AM, please contact night-coverage [email or web address] Password [facility] 06/28/2017, 12:37 PM

## 2017-06-28 NOTE — Progress Notes (Signed)
OT Cancellation Note  Patient Details Name: Suzanne HampshireMisty Pugh MRN: [id number] DOB: 03/19/1971   Cancelled Treatment:    Reason Eval/Treat Not Completed: Medical issues which prohibited therapy. Pt has critical K+ levels outside of therapeutic guidelines range. OT will attempt to see after lunch as schedule allows.   Evern BioLaura J Sara Keys 06/28/2017, 8:17 AM  Sherryl MangesLaura Maddeline Roorda OTR/L [phone]

## 2017-06-28 NOTE — Progress Notes (Signed)
SLP Cancellation Note  Patient Details Name: Suzanne HampshireMisty Pugh MRN: [id number] DOB: 12/12/1970   Cancelled treatment:       Reason Eval/Treat Not Completed: Other (comment) Pt has passed the RN stroke swallow screen and is on a diet of regular textures and thin liquids. RN reports that she was on a clear liquid diet for breakfast due to nausea but that she showed no signs of trouble swallowing. Therefore will defer swallowing evaluation - please re-order if pt has any trouble with advanced diet.  MD may wish to consider ordering SLP cognitive-linguistic evaluation per protocol given acute CVA. Thank you.   Maxcine Hamaiewonsky, Marzell Allemand 06/28/2017, 10:10 AM  Maxcine HamLaura Paiewonsky, M.A. CCC-SLP [phone]

## 2017-06-28 NOTE — Progress Notes (Signed)
RN spoke to lab inquiring about stat magnesium and bmet ordered for patient, lab technician sending phlebotomist. RN will continue to monitor patient.

## 2017-06-28 NOTE — Progress Notes (Signed)
Patient c/o of burning in "bottom of my stomach" and difficulty sleeping. MD paged. 400 mg TUMS PO TID PRN and 25 mg Benadryl PO q HS at bedtime for sleep ordered per Dr. Toniann FailKakrakandy. Patient sleeping. RN will continue to monitor.

## 2017-06-28 NOTE — Progress Notes (Signed)
SLP Cancellation Note  Patient Details Name: Suzanne Pugh MRN: [id number] DOB: 06/13/1971   Cancelled treatment:       Reason Eval/Treat Not Completed: SLP screened, no needs identified, will sign off. Order received again for swallow evaluation but also for Passy Muir Valve. Pt does not have a trach, therefore no need for PMV. RN continues to report good tolerance with regular textures and thin liquids on diet. MD paged with recommendation to consider SLP cognitive-linguistic evaluation as RN does report that pt is having speech difficulties.    Suzanne Hamaiewonsky, Gwynneth Fabio 06/28/2017, 12:44 PM   Suzanne Pugh, M.A. CCC-SLP [phone]

## 2017-06-29 DIAGNOSIS — I959 Hypotension, unspecified: Secondary | ICD-10-CM | POA: Diagnosis not present

## 2017-06-29 DIAGNOSIS — E1169 Type 2 diabetes mellitus with other specified complication: Secondary | ICD-10-CM | POA: Diagnosis not present

## 2017-06-29 DIAGNOSIS — E785 Hyperlipidemia, unspecified: Secondary | ICD-10-CM | POA: Diagnosis not present

## 2017-06-29 DIAGNOSIS — E876 Hypokalemia: Secondary | ICD-10-CM | POA: Diagnosis not present

## 2017-06-29 LAB — BASIC METABOLIC PANEL
Anion gap: 8 (ref 5–15)
BUN: 14 mg/dL (ref 6–20)
CHLORIDE: 105 mmol/L (ref 101–111)
CO2: 26 mmol/L (ref 22–32)
Calcium: 8.5 mg/dL — ABNORMAL LOW (ref 8.9–10.3)
Creatinine, Ser: 0.8 mg/dL (ref 0.44–1.00)
Glucose, Bld: 156 mg/dL — ABNORMAL HIGH (ref 65–99)
Potassium: 3 mmol/L — ABNORMAL LOW (ref 3.5–5.1)
SODIUM: 139 mmol/L (ref 135–145)

## 2017-06-29 LAB — POTASSIUM: Potassium: 3.5 mmol/L (ref 3.5–5.1)

## 2017-06-29 LAB — GLUCOSE, CAPILLARY
GLUCOSE-CAPILLARY: 165 mg/dL — AB (ref 65–99)
Glucose-Capillary: 175 mg/dL — ABNORMAL HIGH (ref 65–99)

## 2017-06-29 LAB — HEMOGLOBIN A1C
Hgb A1c MFr Bld: 8.8 % — ABNORMAL HIGH (ref 4.8–5.6)
MEAN PLASMA GLUCOSE: 206 mg/dL

## 2017-06-29 MED ORDER — POTASSIUM CHLORIDE 10 MEQ/100ML IV SOLN
10.0000 meq | INTRAVENOUS | Status: AC
  Administered 2017-06-29 (×2): 10 meq via INTRAVENOUS
  Filled 2017-06-29 (×3): qty 100

## 2017-06-29 MED ORDER — ROSUVASTATIN CALCIUM 40 MG PO TABS: 40.0000 mg | ORAL_TABLET | Freq: Every day | ORAL | 0 refills | Status: AC

## 2017-06-29 MED ORDER — POTASSIUM CHLORIDE 20 MEQ PO PACK: 40.0000 meq | PACK | Freq: Every day | ORAL | 0 refills | Status: AC

## 2017-06-29 MED ORDER — INSULIN ASPART PROT & ASPART (70-30 MIX) 100 UNIT/ML ~~LOC~~ SUSP: 50.0000 [IU] | Freq: Two times a day (BID) | SUBCUTANEOUS | 0 refills | Status: AC

## 2017-06-29 NOTE — Discharge Instructions (Signed)
Blood Glucose Monitoring, Adult °Monitoring your blood sugar (glucose) helps you manage your diabetes. It also helps you and your health care provider determine how well your diabetes management plan is working. Blood glucose monitoring involves checking your blood glucose as often as directed, and keeping a record (log) of your results over time. °Why should I monitor my blood glucose? °Checking your blood glucose regularly can: °· Help you understand how food, exercise, illnesses, and medicines affect your blood glucose. °· Let you know what your blood glucose is at any time. You can quickly tell if you are having low blood glucose (hypoglycemia) or high blood glucose (hyperglycemia). °· Help you and your health care provider adjust your medicines as needed. ° °When should I check my blood glucose? °Follow instructions from your health care provider about how often to check your blood glucose. This may depend on: °· The type of diabetes you have. °· How well-controlled your diabetes is. °· Medicines you are taking. ° °If you have type 1 diabetes: °· Check your blood glucose at least 2 times a day. °· Also check your blood glucose: °? Before every insulin injection. °? Before and after exercise. °? Between meals. °? 2 hours after a meal. °? Occasionally between 2:00 a.m. and 3:00 a.m., as directed. °? Before potentially dangerous tasks, like driving or using heavy machinery. °? At bedtime. °· You may need to check your blood glucose more often, up to 6-10 times a day: °? If you use an insulin pump. °? If you need multiple daily injections (MDI). °? If your diabetes is not well-controlled. °? If you are ill. °? If you have a history of severe hypoglycemia. °? If you have a history of not knowing when your blood glucose is getting low (hypoglycemia unawareness). °If you have type 2 diabetes: °· If you take insulin or other diabetes medicines, check your blood glucose at least 2 times a day. °· If you are on intensive  insulin therapy, check your blood glucose at least 4 times a day. Occasionally, you may also need to check between 2:00 a.m. and 3:00 a.m., as directed. °· Also check your blood glucose: °? Before and after exercise. °? Before potentially dangerous tasks, like driving or using heavy machinery. °· You may need to check your blood glucose more often if: °? Your medicine is being adjusted. °? Your diabetes is not well-controlled. °? You are ill. °What is a blood glucose log? °· A blood glucose log is a record of your blood glucose readings. It helps you and your health care provider: °? Look for patterns in your blood glucose over time. °? Adjust your diabetes management plan as needed. °· Every time you check your blood glucose, write down your result and notes about things that may be affecting your blood glucose, such as your diet and exercise for the day. °· Most glucose meters store a record of glucose readings in the meter. Some meters allow you to download your records to a computer. °How do I check my blood glucose? °Follow these steps to get accurate readings of your blood glucose: °Supplies needed ° °· Blood glucose meter. °· Test strips for your meter. Each meter has its own strips. You must use the strips that come with your meter. °· A needle to prick your finger (lancet). Do not use lancets more than once. °· A device that holds the lancet (lancing device). °· A journal or log book to write down your results. °Procedure °·   Wash your hands with soap and water.  Prick the side of your finger (not the tip) with the lancet. Use a different finger each time.  Gently rub the finger until a small drop of blood appears.  Follow instructions that come with your meter for inserting the test strip, applying blood to the strip, and using your blood glucose meter.  Write down your result and any notes. Alternative testing sites  Some meters allow you to use areas of your body other than your finger  (alternative sites) to test your blood.  If you think you may have hypoglycemia, or if you have hypoglycemia unawareness, do not use alternative sites. Use your finger instead.  Alternative sites may not be as accurate as the fingers, because blood flow is slower in these areas. This means that the result you get may be delayed, and it may be different from the result that you would get from your finger.  The most common alternative sites are: ? Forearm. ? Thigh. ? Palm of the hand. Additional tips  Always keep your supplies with you.  If you have questions or need help, all blood glucose meters have a 24-hour hotline number that you can call. You may also contact your health care provider.  After you use a few boxes of test strips, adjust (calibrate) your blood glucose meter by following instructions that came with your meter. This information is not intended to replace advice given to you by your health care provider. Make sure you discuss any questions you have with your health care provider. Document Released: 11/14/2003 Document Revised: 05/31/2016 Document Reviewed: 04/22/2016 Elsevier Interactive Patient Education  2017 [employer] Inc[employer]   Cholesterol Cholesterol is a white, waxy, fat-like substance that is needed by the human body in small amounts. The liver makes all the cholesterol we need. Cholesterol is carried from the liver by the blood through the blood vessels. Deposits of cholesterol (plaques) may build up on blood vessel (artery) walls. Plaques make the arteries narrower and stiffer. Cholesterol plaques increase the risk for heart attack and stroke. You cannot feel your cholesterol level even if it is very high. The only way to know that it is high is to have a blood test. Once you know your cholesterol levels, you should keep a record of the test results. Work with your health care provider to keep your levels in the desired range. What do the results mean?  Total  cholesterol is a rough measure of all the cholesterol in your blood.  LDL (low-density lipoprotein) is the bad cholesterol. This is the type that causes plaque to build up on the artery walls. You want this level to be low.  HDL (high-density lipoprotein) is the good cholesterol because it cleans the arteries and carries the LDL away. You want this level to be high.  Triglycerides are fat that the body can either burn for energy or store. High levels are closely linked to heart disease. What are the desired levels of cholesterol?  Total cholesterol below 200.  LDL below 100 for people who are at risk, below 70 for people at very high risk.  HDL above 40 is good. A level of 60 or higher is considered to be protective against heart disease.  Triglycerides below 150. How can I lower my cholesterol? Diet Follow your diet program as told by your health care provider.  Choose fish or white meat chicken and [place], roasted or baked. Limit fatty cuts of red meat, fried foods,  and processed meats, such as sausage and lunch meats.  Eat lots of fresh fruits and vegetables.  Choose whole grains, beans, pasta, potatoes, and cereals.  Choose olive oil, corn oil, or canola oil, and use only small amounts.  Avoid butter, mayonnaise, shortening, or palm kernel oils.  Avoid foods with trans fats.  Drink skim or nonfat milk and eat low-fat or nonfat yogurt and cheeses. Avoid whole milk, cream, ice cream, egg yolks, and full-fat cheeses.  Healthier desserts include angel food cake, ginger snaps, animal crackers, hard candy, popsicles, and low-fat or nonfat frozen yogurt. Avoid pastries, cakes, pies, and cookies.  Exercise  Follow your exercise program as told by your health care provider. A regular program: ? Helps to decrease LDL and raise HDL. ? Helps with weight control.  Do things that increase your activity level, such as gardening, walking, and taking the stairs.  Ask your health  care provider about ways that you can be more active in your daily life.  Medicine  Take over-the-counter and prescription medicines only as told by your health care provider. ? Medicine may be prescribed by your health care provider to help lower cholesterol and decrease the risk for heart disease. This is usually done if diet and exercise have failed to bring down cholesterol levels. ? If you have several risk factors, you may need medicine even if your levels are normal.  This information is not intended to replace advice given to you by your health care provider. Make sure you discuss any questions you have with your health care provider. Document Released: 08/06/2001 Document Revised: 06/08/2016 Document Reviewed: 05/11/2016 Elsevier Interactive Patient Education  2017 [employer]  Hypokalemia Hypokalemia means that the amount of potassium in the blood is lower than normal.Potassium is a chemical that helps regulate the amount of fluid in the body (electrolyte). It also stimulates muscle tightening (contraction) and helps nerves work properly.Normally, most of the bodys potassium is inside of cells, and only a very small amount is in the blood. Because the amount in the blood is so small, minor changes to potassium levels in the blood can be life-threatening. What are the causes? This condition may be caused by:  Antibiotic medicine.  Diarrhea or vomiting. Taking too much of a medicine that helps you have a bowel movement (laxative) can cause diarrhea and lead to hypokalemia.  Chronic kidney disease (CKD).  Medicines that help the body get rid of excess fluid (diuretics).  Eating disorders, such as bulimia.  Low magnesium levels in the body.  Sweating a lot.  What are the signs or symptoms? Symptoms of this condition include:  Weakness.  Constipation.  Fatigue.  Muscle cramps.  Mental confusion.  Skipped heartbeats or irregular heartbeat  (palpitations).  Tingling or numbness.  How is this diagnosed? This condition is diagnosed with a blood test. How is this treated? Hypokalemia can be treated by taking potassium supplements by mouth or adjusting the medicines that you take. Treatment may also include eating more foods that contain a lot of potassium. If your potassium level is very low, you may need to get potassium through an IV tube in one of your veins and be monitored in the hospital. Follow these instructions at home:  Take over-the-counter and prescription medicines only as told by your health care provider. This includes vitamins and supplements.  Eat a healthy diet. A healthy diet includes fresh fruits and vegetables, whole grains, healthy fats, and lean proteins.  If instructed, eat more foods  that contain a lot of potassium, such as: ? Nuts, such as peanuts and pistachios. ? Seeds, such as sunflower seeds and pumpkin seeds. ? Peas, lentils, and lima beans. ? Whole grain and bran cereals and breads. ? Fresh fruits and vegetables, such as apricots, avocado, bananas, cantaloupe, kiwi, oranges, tomatoes, asparagus, and potatoes. ? Orange juice. ? Tomato juice. ? Red meats. ? Yogurt.  Keep all follow-up visits as told by your health care provider. This is important. Contact a health care provider if:  You have weakness that gets worse.  You feel your heart pounding or racing.  You vomit.  You have diarrhea.  You have diabetes (diabetes mellitus) and you have trouble keeping your blood sugar (glucose) in your target range. Get help right away if:  You have chest pain.  You have shortness of breath.  You have vomiting or diarrhea that lasts for more than 2 days.  You faint. This information is not intended to replace advice given to you by your health care provider. Make sure you discuss any questions you have with your health care provider. Document Released: 11/11/2005 Document Revised: 06/29/2016  Document Reviewed: 06/29/2016 Elsevier Interactive Patient Education  2018 [employer]

## 2017-06-29 NOTE — Evaluation (Signed)
Occupational Therapy Evaluation and Discharge Patient Details Name: Suzanne HampshireMisty Pugh MRN: [id number] DOB: 02/03/1971 Today's Date: 06/29/2017    History of Present Illness Pt is a 46 yo female admitted through ED on 06/27/17 following a syncopal episode while out with her husband. Pt has had multiple falls in the past month wtih 2 into bathtub and one backwars into the wheelchair. Pt was found to have hypokalemia and hypotension and was treated in ED. 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    Clinical Impression   Pt with history of 3 recent falls, all in the bathroom. Pt blames distraction and balance for them. Talked extensively about bathroom safety and DME, non-slip mats for tub etc. Also educated Pt on ROM for LUE and elevation for edema. Encouraged weight bearing through LUE. At this time, OT recommending Pt continue with [facility] (was already working with them prior to this hospitalization) to maximize safety and independence in ADL and to work on LUE. Thank you for the opportunity to serve this Pt.    Follow Up Recommendations  Outpatient OT;Supervision - Intermittent (supervision for OOB/mobility)    Equipment Recommendations  3 in 1 bedside commode    Recommendations for Other Services       Precautions / Restrictions Precautions Precautions: Fall Precaution Comments: 3 falls in the past month (all in bathroom) Restrictions Weight Bearing Restrictions: No      Mobility Bed Mobility Overal bed mobility: Modified Independent             General bed mobility comments: Pt able to get back in bed without assist and perform scooting up in the bed as well  Transfers Overall transfer level: Needs assistance Equipment used: None Transfers: Sit to/from Stand Sit to Stand: Supervision         General transfer comment: supervision for  safety    Balance Overall balance assessment: Needs assistance Sitting-balance support: No upper extremity supported;Feet supported Sitting balance-Leahy Scale: Good     Standing balance support: No upper extremity supported;During functional activity Standing balance-Leahy Scale: Fair                             ADL either performed or assessed with clinical judgement   ADL Overall ADL's : Needs assistance/impaired Eating/Feeding: Minimal assistance   Grooming: Wash/dry hands;Wash/dry face;Standing;Minimal assistance Grooming Details (indicate cue type and reason): leaning against sink for stability Upper Body Bathing: Set up Upper Body Bathing Details (indicate cue type and reason): Pt has been getting down into tub for bath Lower Body Bathing: Min guard   Upper Body Dressing : Moderate assistance;Sitting   Lower Body Dressing: Min guard   Toilet Transfer: Min guard;Ambulation;Comfort height toilet;Grab bars Toilet Transfer Details (indicate cue type and reason): Pt using furniture in room for mild stability- will plan on using cane at home Toileting- Clothing Manipulation and Hygiene: Min guard;Sit to/from stand Toileting - Clothing Manipulation Details (indicate cue type and reason): hospital gown, mesh undies, peri care Tub/ Shower Transfer: Tub transfer;Minimal assistance;With caregiver independent assisting Tub/Shower Transfer Details (indicate cue type and reason): Pt has a garden tub, has already talked about getting her husband to put in grab bars Functional mobility during ADLs: Min guard General ADL Comments: LUE has INCREASED deficits in sensation and ROM which impacts her ability to perform ADL independently  Vision Baseline Vision/History: Wears glasses Wears Glasses: At all times Patient Visual Report: Blurring of vision (has resolved) Vision Assessment?: Yes Eye Alignment: Within Functional Limits Ocular Range of Motion: Within Functional  Limits Alignment/Gaze Preference: Within Defined Limits Tracking/Visual Pursuits: Able to track stimulus in all quads without difficulty Visual Fields: No apparent deficits     Perception     Praxis      Pertinent Vitals/Pain Pain Assessment: No/denies pain     Hand Dominance Right   Extremity/Trunk Assessment Upper Extremity Assessment Upper Extremity Assessment: LUE deficits/detail LUE Deficits / Details: no movement in digits, able to move wrist in gravity elminated environment, elbow flexion 75% and shoulder FF limited to 60%. LUE Sensation: decreased light touch ("It feels a little numb") LUE Coordination: decreased fine motor;decreased gross motor   Lower Extremity Assessment Lower Extremity Assessment: Defer to PT evaluation LLE Deficits / Details: weakness related to previous stroke. 4/5 ankle DF, 3/5 PF, 3/5 knee extension, 3-/5 knee flexion, 3-/5 hip flex   Cervical / Trunk Assessment Cervical / Trunk Assessment: Normal   Communication Communication Communication: No difficulties   Cognition Arousal/Alertness: Awake/alert Behavior During Therapy: WFL for tasks assessed/performed Overall Cognitive Status: Within Functional Limits for tasks assessed                                     General Comments  Pt's daughter is getting married in 10 days; Pt educated in elevation and ROM to assist with Edema    Exercises Exercises: Other exercises Other Exercises Other Exercises: Educated Pt in PROM using RUE to assist with LUE; digits, wrist, elbow and shoulder   Shoulder Instructions      Home Living Family/patient expects to be discharged to:: Private residence Living Arrangements: Spouse/significant other;Children Available Help at Discharge: Family Type of Home: Mobile home Home Access: Ramped entrance     Home Layout: One level     Bathroom Shower/Tub: Tub/shower unit;Curtain   Firefighter: Standard     Home Equipment: Environmental consultant - 2  wheels;Wheelchair - manual;Cane - single point          Prior Functioning/Environment Level of Independence: Needs assistance;Independent with assistive device(s)  Gait / Transfers Assistance Needed: used walls to maintain stability, requires min guard for safety ADL's / Homemaking Assistance Needed: requires assistance for meal prep and dressing due to LUE weakness Communication / Swallowing Assistance Needed: some slurred speech Comments: 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        [employer] Problem List: Decreased strength;Decreased range of motion;Decreased activity tolerance;Impaired balance (sitting and/or standing);Decreased coordination;Decreased safety awareness;Impaired sensation;Impaired UE functional use      OT Treatment/Interventions:      OT Goals(Current goals can be found in the care plan section) Acute Rehab OT Goals Patient Stated Goal: to get stronger and back to work OT Goal Formulation: With patient Time For Goal Achievement: 07/13/17 Potential to Achieve Goals: Good  OT Frequency:     Barriers to D/C:            Co-evaluation              AM-PAC PT "6 Clicks" Daily Activity     Outcome Measure Help from another person eating meals?: A Little Help from another person taking care of personal grooming?: A Little Help from another person toileting, which includes using toliet,  bedpan, or urinal?: A Little Help from another person bathing (including washing, rinsing, drying)?: A Little Help from another person to put on and taking off regular upper body clothing?: A Little Help from another person to put on and taking off regular lower body clothing?: A Little 6 Click Score: 18   End of Session Equipment Utilized During Treatment: Gait belt Nurse Communication: Mobility status  Activity Tolerance: Patient tolerated treatment well Patient left: in bed;with call bell/phone within reach;with bed alarm  set;with family/visitor present  OT Visit Diagnosis: Unsteadiness on feet (R26.81);Muscle weakness (generalized) (M62.81);Hemiplegia and hemiparesis Hemiplegia - Right/Left: Left Hemiplegia - dominant/non-dominant: Non-Dominant Hemiplegia - caused by: Cerebral infarction                Time: 2956-2130: 0845-0921 OT Time Calculation (min): 36 min Charges:  OT General Charges $OT Visit: 1 Procedure OT Evaluation $OT Eval Moderate Complexity: 1 Procedure OT Treatments $Self Care/Home Management : 8-22 mins G-Codes: OT G-codes **NOT FOR INPATIENT CLASS** Functional Assessment Tool Used: Clinical judgement Functional Limitation: Self care Self Care Current Status ([id number]([id number]): At least 20 percent but less than 40 percent impaired, limited or restricted Self Care Goal Status ([id number]([id number]): At least 1 percent but less than 20 percent impaired, limited or restricted Self Care Discharge Status [phone]([id number]): At least 20 percent but less than 40 percent impaired, limited or restricted   Sherryl MangesLaura Jiraiya Mcewan OTR/L [phone] Evern BioLaura J Aamina Skiff 06/29/2017, 11:10 AM

## 2017-06-29 NOTE — Discharge Summary (Signed)
Physician Discharge Summary  [place] ZOX:[id number] DOB: 02-Apr-1971 DOA: 06/27/2017  PCP: Wilfred Lacy, MD  Admit date: 06/27/2017 Discharge date: 06/29/2017  Admitted From:home Disposition:home  Recommendations for Outpatient Follow-up:  1. Follow up with PCP in 1-2 weeks 2. Please obtain BMP/CBC in one week   Home Health:yes Equipment/Devices:no Discharge Condition:stable CODE STATUS:full code Diet recommendation:carb modified heart healthy diet  Brief/Interim Summary: 46 y.o.femalewith medical history significant of hypertension, insulin-dependent diabetes, pseudotumor cerebri with chronic headache, hypothyroidism, acute right MCA stroke about a month ago at [facility] with residual left-sided weakness and facial drooping, presented with syncopal episode.  # Syncope in the setting of hypotension: -Patient was found to have systolic blood pressure of 70s by EMS. Antihypertensive medications were recently adjusted including addition of clonidine and up titration of losartan as per patient and her husband. Patient received IV fluid with improvement in blood pressure.  -CT scan of the head consistent with recent stroke. MRI of the brain with no new acute stroke.  -Echocardiogram unremarkable. -Discontinued patient's antihypertensive medication including Lasix, hydrochlorothiazide, clonidine and losartan. Her blood pressure is stable now. Patient symptoms improved. Recommended to follow up with PCP and her neurologist.  #Recent right MCA stroke, subacute: -MRI of the brain consistent with recent stroke. No new finding.  -Continue Plavix. Crestor chest to 40 mg by neurologist. Continue PT OT evaluation. Recommend follow-up with neurologist outpatient.   #Hypokalemia and acute kidney injury:  -Renal function improved. Repleted with both IV and oral potassium chloride. Recommended to continue potassium chloride on discharge and follow-up with PCP. Patient was recommended to monitor  labs in a week. She is off diuretics.  #Diabetes on chronic insulin: Dose of insulin adjusted on discharge. Recommended to monitor blood sugar level and follow-up with PCP.  #HyperlipidemiaThe dose of Crestor increased to 40 mg.  Discharge Diagnoses:  Active Problems:   Syncope and collapse   Hypotension, unspecified    Discharge Instructions  Discharge Instructions    Call MD for:  difficulty breathing, headache or visual disturbances    Complete by:  As directed    Call MD for:  extreme fatigue    Complete by:  As directed    Call MD for:  hives    Complete by:  As directed    Call MD for:  persistant dizziness or light-headedness    Complete by:  As directed    Call MD for:  persistant nausea and vomiting    Complete by:  As directed    Call MD for:  severe uncontrolled pain    Complete by:  As directed    Call MD for:  temperature >100.4    Complete by:  As directed    Diet - low sodium heart healthy    Complete by:  As directed    Diet Carb Modified    Complete by:  As directed    Discharge instructions    Complete by:  As directed    Please monitor blood pressure and blood sugar level at home. Please recheck  lab  BMP to monitor your electrolytes in a week with PCP. Please continue to follow-up with your neurologist.   Increase activity slowly    Complete by:  As directed      Allergies as of 06/29/2017      Reactions   Advair Diskus [fluticasone-salmeterol] Shortness Of Breath, Other (See Comments)   Asthma worsens   Amlodipine Shortness Of Breath, Swelling, Other (See Comments)   Chest pain  Aspirin Shortness Of Breath, Other (See Comments)   wheezing   Calcium Channel Blockers Shortness Of Breath   Capsaicin Other (See Comments)   Respiratory distress (reaction to zostrix)   Carvedilol Shortness Of Breath, Other (See Comments)   tiredness   Cephalexin Shortness Of Breath, Itching   Contrast Media [iodinated Diagnostic Agents] Shortness Of Breath, Other  (See Comments)   Chest pain   Dilaudid [hydromorphone Hcl] Shortness Of Breath, Other (See Comments)   Chest pain   Diltiazem Shortness Of Breath   Hydralazine Shortness Of Breath, Other (See Comments)   Chest pain   Hydrocodone Shortness Of Breath, Itching   Hydromorphone Shortness Of Breath, Itching   Hyzaar [losartan Potassium-hctz] Shortness Of Breath, Other (See Comments)   Chest pain (06/27/17 - pt is currently taking losartan and hctz separately)   Imitrex [sumatriptan] Shortness Of Breath, Other (See Comments)   Chest pain   Irbesartan Shortness Of Breath, Other (See Comments)   Chest pain   Labetalol Shortness Of Breath, Other (See Comments)   Chest pain   Latex Shortness Of Breath, Itching, Swelling, Rash   Lisinopril Anaphylaxis   Metaxalone Shortness Of Breath   Methyldopa Swelling   Metoprolol Shortness Of Breath, Swelling, Other (See Comments)   Chest pain   Micardis [telmisartan] Shortness Of Breath, Swelling, Other (See Comments)   Chest pain   Nadolol Shortness Of Breath, Other (See Comments)   Chest pain   Norvasc [amlodipine Besylate] Shortness Of Breath, Swelling, Other (See Comments)   Chest pain   Olmesartan Shortness Of Breath, Other (See Comments)   Chest pain   Oxycodone-acetaminophen Shortness Of Breath, Itching, Other (See Comments)   Chest pain   Penicillins Itching, Rash   .Has patient had a PCN reaction causing immediate rash, facial/tongue/throat swelling, SOB or lightheadedness with hypotension: Yes Has patient had a PCN reaction causing severe rash involving mucus membranes or skin necrosis: No Has patient had a PCN reaction that required hospitalization: No Has patient had a PCN reaction occurring within the last 10 years: No If all of the above answers are "NO", then may proceed with Cephalosporin use.   Phentermine Shortness Of Breath   Procardia [nifedipine] Shortness Of Breath, Swelling, Other (See Comments)   Migraine, chest pain    Propoxyphene N-acetaminophen Shortness Of Breath, Other (See Comments)   Chest pain   Sumatriptan Succinate Shortness Of Breath, Other (See Comments)   Chest pain   Toprol Xl [metoprolol Tartrate] Shortness Of Breath, Swelling, Other (See Comments)   Chest pain   Tramadol Shortness Of Breath, Other (See Comments)   Chest pain   Verapamil    Zolpidem Shortness Of Breath, Other (See Comments)    Chest pain   Atenolol Other (See Comments)   wheezing   Byetta 10 Mcg Pen [exenatide] Swelling, Other (See Comments)   Elevated liver enzymes, fluid retention   Gabapentin Other (See Comments)   Made her loopy   Topamax [topiramate] Swelling, Other (See Comments)   Elevated liver enzymes, tiredness, fluid retention, chest pain   Amaryl [glimepiride] Other (See Comments)   Exacerbation of IBS   Bystolic [nebivolol Hcl] Nausea Only, Other (See Comments)   Headache, dizziness, constipation   Docosahexaenoic Acid (dha) Swelling   Dulaglutide Other (See Comments)   Nausea and fatigue   Ezetimibe Other (See Comments)   Felt like her muscles wear ripping Leg pain   Hydrochlorothiazide W-triamterene Itching, Swelling   (06-27-17 - pt is currently taking hctz)   Influenza Vaccines  Other (See Comments)   Constant coughing   Ipratropium-albuterol Itching   Lactose Intolerance (gi) Diarrhea   Lovaza [omega-3-acid Ethyl Esters] Swelling   Metformin Other (See Comments)   Exacerbation of IBS   Metformin And Related    Exacerbation of IBS   Morphine And Related Itching   Pt can tolerate with diphenhydramine   Nebivolol Nausea Only, Other (See Comments)   Headache, dizziness, constipation   Other Other (See Comments)   Kdc:ci Pigment blue 63 & methol & nifedipine - causes severe headache Nitrile gloves cause rash (grey gloves) - blue gloves are ok   Potassium Chloride Other (See Comments)   States cannot tolerate PO tablets.  Pt CAN tolerate the blue tablets (Micro-K).  CAN tolerate  KCL IV. Tolerates potassium gluconate.  States when she takes KDUR PO she "swells up like a balloon and has difficulty breathing"   Pregabalin    Hair Loss and swelling   Sitagliptin-metformin Hcl Other (See Comments)   Fluid retention   Triamterene Other (See Comments)   Urination burning and pain   Zocor [simvastatin] Swelling, Other (See Comments)   Muscle pain. elevated liver enzymes   Amoxicillin Itching, Rash   Fentanyl Itching   Methadone Itching   Morphine Itching      Medication List    STOP taking these medications   cloNIDine 0.2 mg/24hr patch Commonly known as:  CATAPRES - Dosed in mg/24 hr   furosemide 40 MG tablet Commonly known as:  LASIX   hydrochlorothiazide 25 MG tablet Commonly known as:  HYDRODIURIL   losartan 25 MG tablet Commonly known as:  COZAAR   potassium chloride 10 MEQ tablet Commonly known as:  K-DUR,KLOR-CON Replaced by:  potassium chloride 20 MEQ packet   potassium gluconate 595 (99 K) MG Tabs tablet     TAKE these medications   butalbital-acetaminophen-caffeine 50-325-40 MG tablet Commonly known as:  FIORICET, ESGIC Take 1 tablet by mouth every 4 (four) hours as needed (pain).   clopidogrel 75 MG tablet Commonly known as:  PLAVIX Take 75 mg by mouth at bedtime.   diphenhydrAMINE 25 MG tablet Commonly known as:  BENADRYL Take 25 mg by mouth at bedtime.   insulin aspart protamine- aspart (70-30) 100 UNIT/ML injection Commonly known as:  NOVOLOG MIX 70/30 Inject 0.5 mLs (50 Units total) into the skin 2 (two) times daily. What changed:  how much to take   levalbuterol 45 MCG/ACT inhaler Commonly known as:  XOPENEX HFA Inhale 2 puffs into the lungs every 4 (four) hours as needed for wheezing or shortness of breath.   levothyroxine 88 MCG tablet Commonly known as:  SYNTHROID, LEVOTHROID Take 88 mcg by mouth daily before breakfast.   multivitamin with minerals Tabs tablet Take 1 tablet by mouth daily.   OVER THE COUNTER  MEDICATION Take 1 tablet by mouth See admin instructions. Alatonic supplement from whole foods - for breathing - take 1 tablet by mouth daily   OVER THE COUNTER MEDICATION Take by mouth See admin instructions. Colloidal silver - Take 2 droppersful by mouth daily at bedtime   potassium chloride 20 MEQ packet Commonly known as:  KLOR-CON Take 40 mEq by mouth daily. Replaces:  potassium chloride 10 MEQ tablet   rosuvastatin 40 MG tablet Commonly known as:  CRESTOR Take 1 tablet (40 mg total) by mouth daily. What changed:  medication strength  how much to take  when to take this   vitamin C 250 MG tablet Commonly known  as:  ASCORBIC ACID Take 750 mg by mouth daily.      Follow-up Information    Wilfred Lacy, MD. Schedule an appointment as soon as possible for a visit in 1 week(s).   Specialty:  Internal Medicine Contact information: [address] [place] [address] [phone]          Allergies  Allergen Reactions  . Advair Diskus [Fluticasone-Salmeterol] Shortness Of Breath and Other (See Comments)    Asthma worsens   . Amlodipine Shortness Of Breath, Swelling and Other (See Comments)    Chest pain  . Aspirin Shortness Of Breath and Other (See Comments)    wheezing  . Calcium Channel Blockers Shortness Of Breath  . Capsaicin Other (See Comments)    Respiratory distress (reaction to zostrix)  . Carvedilol Shortness Of Breath and Other (See Comments)    tiredness  . Cephalexin Shortness Of Breath and Itching  . Contrast Media [Iodinated Diagnostic Agents] Shortness Of Breath and Other (See Comments)    Chest pain  . Dilaudid [Hydromorphone Hcl] Shortness Of Breath and Other (See Comments)    Chest pain  . Diltiazem Shortness Of Breath  . Hydralazine Shortness Of Breath and Other (See Comments)    Chest pain  . Hydrocodone Shortness Of Breath and Itching  . Hydromorphone Shortness Of Breath and Itching  . Hyzaar [Losartan Potassium-Hctz] Shortness  Of Breath and Other (See Comments)    Chest pain (06/27/17 - pt is currently taking losartan and hctz separately)  . Imitrex [Sumatriptan] Shortness Of Breath and Other (See Comments)    Chest pain   . Irbesartan Shortness Of Breath and Other (See Comments)    Chest pain  . Labetalol Shortness Of Breath and Other (See Comments)    Chest pain  . Latex Shortness Of Breath, Itching, Swelling and Rash  . Lisinopril Anaphylaxis  . Metaxalone Shortness Of Breath  . Methyldopa Swelling  . Metoprolol Shortness Of Breath, Swelling and Other (See Comments)    Chest pain  . Micardis [Telmisartan] Shortness Of Breath, Swelling and Other (See Comments)    Chest pain  . Nadolol Shortness Of Breath and Other (See Comments)    Chest pain  . Norvasc [Amlodipine Besylate] Shortness Of Breath, Swelling and Other (See Comments)    Chest pain  . Olmesartan Shortness Of Breath and Other (See Comments)    Chest pain  . Oxycodone-Acetaminophen Shortness Of Breath, Itching and Other (See Comments)    Chest pain  . Penicillins Itching and Rash    .Has patient had a PCN reaction causing immediate rash, facial/tongue/throat swelling, SOB or lightheadedness with hypotension: Yes Has patient had a PCN reaction causing severe rash involving mucus membranes or skin necrosis: No Has patient had a PCN reaction that required hospitalization: No Has patient had a PCN reaction occurring within the last 10 years: No If all of the above answers are "NO", then may proceed with Cephalosporin use.  Marland Kitchen Phentermine Shortness Of Breath  . Procardia [Nifedipine] Shortness Of Breath, Swelling and Other (See Comments)    Migraine, chest pain  . Propoxyphene N-Acetaminophen Shortness Of Breath and Other (See Comments)    Chest pain  . Sumatriptan Succinate Shortness Of Breath and Other (See Comments)    Chest pain  . Toprol Xl [Metoprolol Tartrate] Shortness Of Breath, Swelling and Other (See Comments)    Chest pain  . Tramadol  Shortness Of Breath and Other (See Comments)    Chest pain  . Verapamil   .  Zolpidem Shortness Of Breath and Other (See Comments)     Chest pain  . Atenolol Other (See Comments)    wheezing  . Byetta 10 Mcg Pen [Exenatide] Swelling and Other (See Comments)    Elevated liver enzymes, fluid retention   . Gabapentin Other (See Comments)    Made her loopy  . Topamax [Topiramate] Swelling and Other (See Comments)    Elevated liver enzymes, tiredness, fluid retention, chest pain   . Amaryl [Glimepiride] Other (See Comments)    Exacerbation of IBS   . Bystolic [Nebivolol Hcl] Nausea Only and Other (See Comments)    Headache, dizziness, constipation  . Docosahexaenoic Acid (Dha) Swelling  . Dulaglutide Other (See Comments)    Nausea and fatigue  . Ezetimibe Other (See Comments)    Felt like her muscles wear ripping Leg pain  . Hydrochlorothiazide W-Triamterene Itching and Swelling    (06-27-17 - pt is currently taking hctz)  . Influenza Vaccines Other (See Comments)    Constant coughing  . Ipratropium-Albuterol Itching  . Lactose Intolerance (Gi) Diarrhea  . Lovaza [Omega-3-Acid Ethyl Esters] Swelling  . Metformin Other (See Comments)    Exacerbation of IBS  . Metformin And Related     Exacerbation of IBS  . Morphine And Related Itching    Pt can tolerate with diphenhydramine  . Nebivolol Nausea Only and Other (See Comments)    Headache, dizziness, constipation  . Other Other (See Comments)    Kdc:ci Pigment blue 63 & methol & nifedipine - causes severe headache Nitrile gloves cause rash (grey gloves) - blue gloves are ok  . Potassium Chloride Other (See Comments)    States cannot tolerate PO tablets.  Pt CAN tolerate the blue tablets (Micro-K).  CAN tolerate KCL IV. Tolerates potassium gluconate.  States when she takes KDUR PO she "swells up like a balloon and has difficulty breathing"  . Pregabalin     Hair Loss and swelling  . Sitagliptin-Metformin Hcl Other (See  Comments)    Fluid retention  . Triamterene Other (See Comments)    Urination burning and pain  . Zocor [Simvastatin] Swelling and Other (See Comments)    Muscle pain. elevated liver enzymes   . Amoxicillin Itching and Rash  . Fentanyl Itching  . Methadone Itching  . Morphine Itching    Consultations: Neurologist  Procedures/Studies: MRI brain and echocardiogram  Subjective: Seen and examined at bedside. Denied headache, dizziness, nausea vomiting chest pressure shortness of breath. Mental status around baseline.  Discharge Exam: Vitals:   06/29/17 0125 06/29/17 1014  BP: 130/60 139/75  Pulse: 74 69  Resp: 16 18  Temp: 98 F (36.7 C) 98.8 F (37.1 C)   Vitals:   06/28/17 1645 06/28/17 2130 06/29/17 0125 06/29/17 1014  BP: 135/64 127/74 130/60 139/75  Pulse: 71 79 74 69  Resp: 16 20 16 18   Temp: (!) 97.4 F (36.3 C) 97.9 F (36.6 C) 98 F (36.7 C) 98.8 F (37.1 C)  TempSrc: Oral Oral Oral Oral  SpO2: 99% 100% 100% 100%  Weight:      Height:        General: Pt is alert, awake, not in acute distress Cardiovascular: RRR, S1/S2 +, no rubs, no gallops Respiratory: CTA bilaterally, no wheezing, no rhonchi Abdominal: Soft, NT, ND, bowel sounds + Extremities: no edema, no cyanosis Neurology: Left-sided weakness and left facial droop unchanged. Patient is alert awake and oriented.   The results of significant diagnostics from this hospitalization (including  imaging, microbiology, ancillary and laboratory) are listed below for reference.     Microbiology: No results found for this or any previous visit (from the past 240 hour(s)).   Labs: BNP (last 3 results) No results for input(s): BNP in the last 8760 hours. Basic Metabolic Panel:  Recent Labs Lab 06/27/17 1638 06/27/17 1732 06/28/17 0405 06/29/17 0306  NA 139 139 138 139  K 2.3* 2.4* 2.3* 3.0*  CL 93* 96* 102 105  CO2  --  32 27 26  GLUCOSE 139* 143* 159* 156*  BUN 21* 19 18 14   CREATININE 1.50*  1.66* 1.02* 0.80  CALCIUM  --  8.9 8.3* 8.5*  MG  --  2.1 2.1  2.0  --    Liver Function Tests:  Recent Labs Lab 06/27/17 1732 06/28/17 0405  AST 46* 31  ALT 46 38  ALKPHOS 94 86  BILITOT 0.5 0.5  PROT 7.5 6.6  ALBUMIN 3.4* 3.0*   No results for input(s): LIPASE, AMYLASE in the last 168 hours. No results for input(s): AMMONIA in the last 168 hours. CBC:  Recent Labs Lab 06/27/17 1638 06/27/17 1732 06/28/17 0405  WBC  --  15.1* 12.3*  NEUTROABS  --  9.3*  --   HGB 11.9* 11.7* 10.9*  HCT 35.0* 36.2 34.3*  MCV  --  87.7 87.1  PLT  --  428* 361   Cardiac Enzymes: No results for input(s): CKTOTAL, CKMB, CKMBINDEX, TROPONINI in the last 168 hours. BNP: Invalid input(s): POCBNP CBG:  Recent Labs Lab 06/28/17 0608 06/28/17 1104 06/28/17 1611 06/28/17 2118 06/29/17 0650  GLUCAP 152* 161* 135* 214* 165*   D-Dimer No results for input(s): DDIMER in the last 72 hours. Hgb A1c No results for input(s): HGBA1C in the last 72 hours. Lipid Profile  Recent Labs  06/28/17 0405  CHOL 131  HDL 29*  LDLCALC 67  TRIG 409173*  CHOLHDL 4.5   Thyroid function studies  Recent Labs  06/27/17 2227  TSH 1.205   Anemia work up No results for input(s): VITAMINB12, FOLATE, FERRITIN, TIBC, IRON, RETICCTPCT in the last 72 hours. Urinalysis    Component Value Date/Time   COLORURINE YELLOW 06/28/2017 0029   APPEARANCEUR HAZY (A) 06/28/2017 0029   LABSPEC 1.016 06/28/2017 0029   PHURINE 7.0 06/28/2017 0029   GLUCOSEU NEGATIVE 06/28/2017 0029   HGBUR NEGATIVE 06/28/2017 0029   BILIRUBINUR NEGATIVE 06/28/2017 0029   KETONESUR NEGATIVE 06/28/2017 0029   PROTEINUR NEGATIVE 06/28/2017 0029   UROBILINOGEN 0.2 06/25/2013 2343   NITRITE NEGATIVE 06/28/2017 0029   LEUKOCYTESUR NEGATIVE 06/28/2017 0029   Sepsis Labs Invalid input(s): PROCALCITONIN,  WBC,  LACTICIDVEN Microbiology No results found for this or any previous visit (from the past 240 hour(s)).   Time  coordinating discharge: 28 minutes  SIGNED:   Maxie Barbron Prasad Allysha Tryon, MD  [facility] 06/29/2017, 11:20 AM  If 7PM-7AM, please contact night-coverage [email or web address] Password [email or web address]

## 2017-06-29 NOTE — Evaluation (Addendum)
Physical Therapy Evaluation Patient Details Name: Suzanne HampshireMisty Pugh MRN: [id number] DOB: 12/17/1970 Today's Date: 06/29/2017   History of Present Illness  Pt is a 46 yo female admitted through ED on 06/27/17 following a syncopal episode while out with her husband. Pt has had multiple falls in the past month wtih 2 into bathtub and one backwars into the wheelchair. Pt was found to have hypokalemia and hypotension and was treated in ED. 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   Clinical Impression  Pt presents with the above diagnosis and below deficits for therapy evaluation. Prior to admission, pt lived with her family in a single level home with a ramp. Pt was relying on her WC for mobility due to fatigue and multiple falls over the past month since recent stroke. Pt has noticed a significant reduction in her LUE strength as compared to right after her stroke. Pt requires supervision to Min guard for mobility in due to tendency to drift to left with mobility and high risk for falls. Pt utilized a Dewey for gait this session with improved stability and was advised to continue using this when she returns home. Pt will benefit from returning to outpatient therapy to follow-up with strengthening and balance training and will continue to benefit from acute PT follow-up to address the below deficits prior to discharge home.     Follow Up Recommendations Outpatient PT;Supervision for mobility/OOB    Equipment Recommendations  Cane;3in1 (PT)    Recommendations for Other Services       Precautions / Restrictions Precautions Precautions: Fall Precaution Comments: 3 falls in the past month Restrictions Weight Bearing Restrictions: No      Mobility  Bed Mobility               General bed mobility comments: pt in bathroom with PT arrives  Transfers Overall  transfer level: Needs assistance Equipment used: None Transfers: Sit to/from Stand Sit to Stand: Supervision         General transfer comment: supervision for safety  Ambulation/Gait Ambulation/Gait assistance: Min guard Ambulation Distance (Feet): 200 Feet Assistive device: Straight cane Gait Pattern/deviations: Step-through pattern;Drifts right/left;Narrow base of support Gait velocity: decreased Gait velocity interpretation: Below normal speed for age/gender General Gait Details: Pt tends to drift to left with gait requiring visual cues to centralize her sequencing and reduce moving toward wall. Cues for heel strike to reduce foot drop LLE.   Stairs            Wheelchair Mobility    Modified Rankin (Stroke Patients Only)       Balance Overall balance assessment: Needs assistance Sitting-balance support: No upper extremity supported;Feet supported Sitting balance-Leahy Scale: Good     Standing balance support: Single extremity supported;No upper extremity supported;During functional activity Standing balance-Leahy Scale: Fair                               Pertinent Vitals/Pain Pain Assessment: No/denies pain    Home Living Family/patient expects to be discharged to:: Private residence Living Arrangements: Spouse/significant other;Children Available Help at Discharge: Family Type of Home: Apartment Home Access: Ramped entrance     Home Layout: One level Home Equipment: Environmental consultantWalker - 2 wheels;Wheelchair - manual;Cane - single point      Prior Function Level of Independence: Needs assistance;Independent with assistive device(s)  Gait / Transfers Assistance Needed: used walls to maintain stability, requires min guard for safety  ADL's / Homemaking Assistance Needed: requires assistance for meal prep and dressing due to LUE weakness  Comments: 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 4/5 ankle DF, 3/5 PF, 3/5 knee extension, 3-/5 knee flexion, 3-/5 hip flex    Cervical / Trunk Assessment Cervical / Trunk Assessment: Normal  Communication   Communication: No difficulties  Cognition Arousal/Alertness: Awake/alert Behavior During Therapy: WFL for tasks assessed/performed Overall Cognitive Status: Within Functional Limits for tasks assessed                                        General Comments      Exercises     Assessment/Plan    PT Assessment Patient needs continued PT services  PT Problem List Decreased strength;Decreased activity tolerance;Decreased balance;Decreased mobility;Decreased knowledge of use of DME;Decreased safety awareness       PT Treatment Interventions DME instruction;Gait training;Functional mobility training;Therapeutic activities;Therapeutic exercise;Balance training;Patient/family education    PT Goals (Current goals can be found in the Care Plan section)  Acute Rehab PT Goals Patient Stated Goal: to get stronger and back to work PT Goal Formulation: With patient Time For Goal Achievement: 07/06/17 Potential to Achieve Goals: Good    Frequency Min 5X/week   Barriers to discharge        Co-evaluation               AM-PAC PT "6 Clicks" Daily Activity  Outcome Measure Difficulty turning over in bed (including adjusting bedclothes, sheets and blankets)?: None Difficulty moving from lying on back to sitting on the side of the bed? : None Difficulty sitting down on and standing up from a chair with arms (e.g., wheelchair, bedside commode, etc,.)?: A Little Help needed moving to and from a bed to chair (including a  wheelchair)?: A Little Help needed walking in hospital room?: A Little Help needed climbing 3-5 steps with a railing? : A Little 6 Click Score: 20    End of Session Equipment Utilized During Treatment: Gait belt Activity Tolerance: Patient tolerated treatment well Patient left: in chair;with call bell/phone within reach;with family/visitor present;Other (comment) (OT) Nurse Communication: Mobility status PT Visit Diagnosis: Unsteadiness on feet (R26.81);Other abnormalities of gait and mobility (R26.89)    Time: [id number] PT Time Calculation (min) (ACUTE ONLY): 24 min   Charges:   PT Evaluation $PT Eval Moderate Complexity: 1 Mod PT Treatments $Gait Training: 8-22 mins   PT G Codes:   PT G-Codes **NOT FOR INPATIENT CLASS** Functional Assessment Tool Used: AM-PAC 6 Clicks Basic Mobility;Clinical judgement Functional Limitation: Mobility: Walking and moving around Mobility: Walking and Moving Around Current Status ([id number]): At least 20 percent but less than 40 percent impaired, limited or restricted Mobility: Walking and Moving Around Goal Status [phone]): At least 1 percent but less than 20 percent impaired, limited or restricted    Colin BroachSabra M. Beuna Bolding PT, DPT  [phone]   Roxy MannsSabra Marie Sevin Farone 06/29/2017, 10:33 AM

## 2017-06-29 NOTE — Care Management Note (Signed)
Case Management Note Donn PieriniKristi Makaelah Cranfield RN, BSN Unit 4E-Case Manager-- [facility]5C coverage [phone]  Patient Details  Name: Suzanne HampshireMisty Pugh MRN: [id number] Date of Birth: 04/21/1971  Subjective/Objective:  Pt presented with syncope                  Action/Plan: Pt lived at home with family- plan for pt to return home with family- orders placed for HHRN/PT/OT/aide- spoke with pt at bedside- per conversation pt states that she currently is going to outpt therapies and would prefer to continue with this arrangement as [facility]- she reports that she has appointments made for outpt PT/OT/ST for the next 6-8 weeks [facility]. She also has needed DME that includes w/c, RW, cane, and shower chair. No HH referral made as plan per pt is to continue outpt services.    Expected Discharge Date:  06/29/17               Expected Discharge Plan:  Home/Self Care  In-House Referral:     Discharge planning Services  CM Consult  Post Acute Care Choice:  Home Health Choice offered to:  Patient  DME Arranged:  N/A DME Agency:  NA  HH Arranged:  RN, PT, OT, Nurse's Aide HH Agency:  Other - See comment  Status of Service:  Completed, signed off  If discussed at Long Length of Stay Meetings, dates discussed:    Discharge Disposition: home/self care   Additional Comments:  Darrold SpanWebster, Aracely Rickett Hall, RN 06/29/2017, 11:43 AM

## 2017-06-29 NOTE — Progress Notes (Signed)
Patient taken out to private vehicle by wheelchair accompanied by staff.  All belongings with patient.

## 2017-06-29 NOTE — Progress Notes (Signed)
Patient given discharge instructions. All questions and concerns addressed.   

## 2017-06-30 ENCOUNTER — Emergency Department (HOSPITAL_COMMUNITY): Payer: BLUE CROSS/BLUE SHIELD

## 2017-06-30 ENCOUNTER — Other Ambulatory Visit: Payer: Self-pay

## 2017-06-30 ENCOUNTER — Emergency Department (HOSPITAL_COMMUNITY)
Admission: EM | Admit: 2017-06-30 | Discharge: 2017-06-30 | Disposition: A | Discharge status: Home / Self Care | Payer: BLUE CROSS/BLUE SHIELD | Attending: Emergency Medicine | Admitting: Emergency Medicine

## 2017-06-30 ENCOUNTER — Encounter (HOSPITAL_COMMUNITY): Payer: Self-pay

## 2017-06-30 DIAGNOSIS — R200 Anesthesia of skin: Secondary | ICD-10-CM | POA: Diagnosis present

## 2017-06-30 DIAGNOSIS — J45909 Unspecified asthma, uncomplicated: Secondary | ICD-10-CM | POA: Diagnosis not present

## 2017-06-30 DIAGNOSIS — Z8673 Personal history of transient ischemic attack (TIA), and cerebral infarction without residual deficits: Secondary | ICD-10-CM | POA: Diagnosis not present

## 2017-06-30 DIAGNOSIS — I10 Essential (primary) hypertension: Secondary | ICD-10-CM | POA: Diagnosis not present

## 2017-06-30 DIAGNOSIS — Z880 Allergy status to penicillin: Secondary | ICD-10-CM | POA: Diagnosis not present

## 2017-06-30 DIAGNOSIS — Z794 Long term (current) use of insulin: Secondary | ICD-10-CM | POA: Diagnosis not present

## 2017-06-30 DIAGNOSIS — Z79899 Other long term (current) drug therapy: Secondary | ICD-10-CM | POA: Insufficient documentation

## 2017-06-30 DIAGNOSIS — Z7902 Long term (current) use of antithrombotics/antiplatelets: Secondary | ICD-10-CM | POA: Diagnosis not present

## 2017-06-30 DIAGNOSIS — Z885 Allergy status to narcotic agent status: Secondary | ICD-10-CM | POA: Insufficient documentation

## 2017-06-30 DIAGNOSIS — E119 Type 2 diabetes mellitus without complications: Secondary | ICD-10-CM | POA: Insufficient documentation

## 2017-06-30 LAB — COMPREHENSIVE METABOLIC PANEL
ALT: 45 U/L (ref 14–54)
AST: 38 U/L (ref 15–41)
Albumin: 3.3 g/dL — ABNORMAL LOW (ref 3.5–5.0)
Alkaline Phosphatase: 94 U/L (ref 38–126)
Anion gap: 10 (ref 5–15)
BUN: 10 mg/dL (ref 6–20)
CHLORIDE: 106 mmol/L (ref 101–111)
CO2: 26 mmol/L (ref 22–32)
Calcium: 8.9 mg/dL (ref 8.9–10.3)
Creatinine, Ser: 0.74 mg/dL (ref 0.44–1.00)
Glucose, Bld: 98 mg/dL (ref 65–99)
POTASSIUM: 3.2 mmol/L — AB (ref 3.5–5.1)
SODIUM: 142 mmol/L (ref 135–145)
Total Bilirubin: 0.4 mg/dL (ref 0.3–1.2)
Total Protein: 6.8 g/dL (ref 6.5–8.1)

## 2017-06-30 LAB — PROTIME-INR
INR: 0.96
PROTHROMBIN TIME: 12.7 s (ref 11.4–15.2)

## 2017-06-30 LAB — I-STAT TROPONIN, ED: TROPONIN I, POC: 0 ng/mL (ref 0.00–0.08)

## 2017-06-30 LAB — I-STAT CHEM 8, ED
BUN: 11 mg/dL (ref 6–20)
Calcium, Ion: 1.18 mmol/L (ref 1.15–1.40)
Chloride: 104 mmol/L (ref 101–111)
Creatinine, Ser: 0.6 mg/dL (ref 0.44–1.00)
Glucose, Bld: 101 mg/dL — ABNORMAL HIGH (ref 65–99)
HEMATOCRIT: 32 % — AB (ref 36.0–46.0)
HEMOGLOBIN: 10.9 g/dL — AB (ref 12.0–15.0)
POTASSIUM: 3.2 mmol/L — AB (ref 3.5–5.1)
SODIUM: 143 mmol/L (ref 135–145)
TCO2: 26 mmol/L (ref 0–100)

## 2017-06-30 LAB — CBC
HEMATOCRIT: 32 % — AB (ref 36.0–46.0)
Hemoglobin: 10.2 g/dL — ABNORMAL LOW (ref 12.0–15.0)
MCH: 27.7 pg (ref 26.0–34.0)
MCHC: 31.9 g/dL (ref 30.0–36.0)
MCV: 87 fL (ref 78.0–100.0)
PLATELETS: 330 10*3/uL (ref 150–400)
RBC: 3.68 MIL/uL — ABNORMAL LOW (ref 3.87–5.11)
RDW: 14.3 % (ref 11.5–15.5)
WBC: 9.3 10*3/uL (ref 4.0–10.5)

## 2017-06-30 LAB — DIFFERENTIAL
BASOS ABS: 0 10*3/uL (ref 0.0–0.1)
BASOS PCT: 0 %
EOS ABS: 0.2 10*3/uL (ref 0.0–0.7)
Eosinophils Relative: 2 %
Lymphocytes Relative: 36 %
Lymphs Abs: 3.4 10*3/uL (ref 0.7–4.0)
MONOS PCT: 5 %
Monocytes Absolute: 0.5 10*3/uL (ref 0.1–1.0)
NEUTROS ABS: 5.3 10*3/uL (ref 1.7–7.7)
Neutrophils Relative %: 57 %

## 2017-06-30 LAB — APTT: APTT: 28 s (ref 24–36)

## 2017-06-30 LAB — CBG MONITORING, ED: Glucose-Capillary: 106 mg/dL — ABNORMAL HIGH (ref 65–99)

## 2017-06-30 NOTE — ED Notes (Signed)
Attempted to get blood unsuccesfull

## 2017-06-30 NOTE — ED Provider Notes (Signed)
[facility]-EMERGENCY DEPT Provider Note   CSN: [id number] Arrival date & time: 06/30/17  1306     History   Chief Complaint Chief Complaint  Patient presents with  . Numbness    HPI Suzanne Pugh is a 46 y.o. female.  Patient status post CVA right MCA distribution early July. Patient was concerned about recurrent symptoms and had admission here recently August 3 August 5. But MRI showed subacute findings nothing acute. Patient today at 9 in the morning started with new numbness to the left foot top and bottom and distal leg as well as some numbness to left arm. These were new symptoms. Patient concerned about recurrent stroke. Patient is followed by neurology at [facility]. No other specific complaints.      Past Medical History:  Diagnosis Date  . Asthma   . Hypertension   . Insulin dependent diabetes mellitus (HCC)   . Peripheral edema   . Pseudotumor cerebri   . Stroke [facility])     Patient Active Problem List   Diagnosis Date Noted  . Type 2 diabetes mellitus with hyperlipidemia (HCC)   . Syncope and collapse 06/27/2017  . Hypotension, unspecified 06/27/2017  . History of right MCA stroke   . AKI (acute kidney injury) (HCC)   . Hypokalemia     History reviewed. No pertinent surgical history.  OB History    No data available       Home Medications    Prior to Admission medications   Medication Sig Start Date End Date Taking? Authorizing Provider  butalbital-acetaminophen-caffeine (FIORICET, ESGIC) 50-325-40 MG tablet Take 1 tablet by mouth every 4 (four) hours as needed (pain).     [provider]  clopidogrel (PLAVIX) 75 MG tablet Take 75 mg by mouth at bedtime. 05/31/17 06/30/17  [provider]  diphenhydrAMINE (BENADRYL) 25 MG tablet Take 25 mg by mouth at bedtime.    [provider]  insulin aspart protamine- aspart (NOVOLOG MIX 70/30) (70-30) 100 UNIT/ML injection Inject 0.5 mLs (50 Units total) into the skin 2 (two) times daily.  06/29/17   Maxie Barb, MD  levalbuterol [facility] HFA) 45 MCG/ACT inhaler Inhale 2 puffs into the lungs every 4 (four) hours as needed for wheezing or shortness of breath.    [provider]  levothyroxine (SYNTHROID, LEVOTHROID) 88 MCG tablet Take 88 mcg by mouth daily before breakfast.    [provider]  Multiple Vitamin (MULTIVITAMIN WITH MINERALS) TABS Take 1 tablet by mouth daily.     [provider]  OVER THE COUNTER MEDICATION Take 1 tablet by mouth See admin instructions. Alatonic supplement from whole foods - for breathing - take 1 tablet by mouth daily    [provider]  OVER THE COUNTER MEDICATION Take by mouth See admin instructions. Colloidal silver - Take 2 droppersful by mouth daily at bedtime    [provider]  potassium chloride (KLOR-CON) 20 MEQ packet Take 40 mEq by mouth daily. 06/29/17   Maxie Barb, MD  rosuvastatin (CRESTOR) 40 MG tablet Take 1 tablet (40 mg total) by mouth daily. 06/30/17   Maxie Barb, MD  vitamin C (ASCORBIC ACID) 250 MG tablet Take 750 mg by mouth daily.    [provider]    Family History No family history on file.  Social History Social History  Substance Use Topics  . Smoking status: Never Smoker  . Smokeless tobacco: Never Used  . Alcohol use No     Allergies  Advair diskus [fluticasone-salmeterol]; Amlodipine; Aspirin; Calcium channel blockers; Capsaicin; Carvedilol; Cephalexin; Contrast media [iodinated diagnostic agents]; Dilaudid [hydromorphone hcl]; Diltiazem; Hydralazine; Hydrocodone; Hydromorphone; Hyzaar [losartan potassium-hctz]; Imitrex [sumatriptan]; Irbesartan; Labetalol; Latex; Lisinopril; Metaxalone; Methyldopa; Metoprolol; Micardis [telmisartan]; Nadolol; Norvasc [amlodipine besylate]; Olmesartan; Oxycodone-acetaminophen; Penicillins; Phentermine; Procardia [nifedipine]; Propoxyphene n-acetaminophen; Sumatriptan succinate; Toprol xl [metoprolol  tartrate]; Tramadol; Verapamil; Zolpidem; Atenolol; Byetta 10 mcg pen [exenatide]; Gabapentin; Topamax [topiramate]; Amaryl [glimepiride]; Bystolic [nebivolol hcl]; Docosahexaenoic acid (dha); Dulaglutide; Ezetimibe; Hydrochlorothiazide w-triamterene; Influenza vaccines; Ipratropium-albuterol; Lactose intolerance (gi); Lovaza [omega-3-acid ethyl esters]; Metformin; Metformin and related; Morphine and related; Nebivolol; Other; Potassium chloride; Pregabalin; Sitagliptin-metformin hcl; Triamterene; Zocor [simvastatin]; Amoxicillin; Fentanyl; Methadone; and Morphine   Review of Systems Review of Systems  Constitutional: Negative for fever.  HENT: Negative for congestion.   Eyes: Negative for visual disturbance.  Respiratory: Negative for shortness of breath.   Cardiovascular: Negative for chest pain.  Gastrointestinal: Negative for abdominal pain.  Musculoskeletal: Negative for back pain.  Skin: Negative for rash.  Neurological: Positive for speech difficulty, weakness and numbness.  Hematological: Does not bruise/bleed easily.  Psychiatric/Behavioral: Negative for confusion.     Physical Exam Updated Vital Signs BP (!) 185/79   Pulse 67   Temp 98.2 F (36.8 C) (Oral)   Resp 17   LMP 06/29/2017 (Exact Date)   SpO2 97%   Physical Exam  Constitutional: She is oriented to person, place, and time. She appears well-developed and well-nourished. No distress.  HENT:  Head: Normocephalic and atraumatic.  Right Ear: External ear normal.  Mouth/Throat: Oropharynx is clear and moist.  Eyes: Pupils are equal, round, and reactive to light. Conjunctivae and EOM are normal.  Neck: Neck supple.  Cardiovascular: Normal rate and regular rhythm.   Pulmonary/Chest: Effort normal.  Abdominal: Soft. Bowel sounds are normal. There is no tenderness.  Musculoskeletal: Normal range of motion.  Neurological: She is alert and oriented to person, place, and time.  Patient with some speech difficulty  secondary to previous stroke. Patient with some left upper extremity weakness. New today is subjective sensory deficit to the top and bottom of the left foot.  Skin: Skin is warm.  Nursing note and vitals reviewed.    ED Treatments / Results  Labs (all labs ordered are listed, but only abnormal results are displayed) Labs Reviewed  CBC - Abnormal; Notable for the following:       Result Value   RBC 3.68 (*)    Hemoglobin 10.2 (*)    HCT 32.0 (*)    All other components within normal limits  COMPREHENSIVE METABOLIC PANEL - Abnormal; Notable for the following:    Potassium 3.2 (*)    Albumin 3.3 (*)    All other components within normal limits  CBG MONITORING, ED - Abnormal; Notable for the following:    Glucose-Capillary 106 (*)    All other components within normal limits  I-STAT CHEM 8, ED - Abnormal; Notable for the following:    Potassium 3.2 (*)    Glucose, Bld 101 (*)    Hemoglobin 10.9 (*)    HCT 32.0 (*)    All other components within normal limits  PROTIME-INR  APTT  DIFFERENTIAL  I-STAT TROPONIN, ED    EKG  EKG Interpretation None       Radiology Ct Head Wo Contrast  Result Date: 06/30/2017 CLINICAL DATA:  Recent right-sided infarcts EXAM: CT HEAD WITHOUT CONTRAST TECHNIQUE: Contiguous axial images were obtained from the base of the skull through the vertex without intravenous contrast. COMPARISON:  06/27/2017 FINDINGS: Brain: There are prior focal areas of ischemia and noted stable from the prior exam consistent with prior MCA infarction. No new area of acute hemorrhage or infarct is seen. Vascular: No hyperdense vessel or unexpected calcification. Skull: Normal. Negative for fracture or focal lesion. Sinuses/Orbits: No acute finding. Other: None. IMPRESSION: Areas of prior ischemia on the right in the distribution of the MCA similar to that seen on the prior exam. Electronically Signed   By: Alcide Clever M.D.   On: 06/30/2017 15:36   Mr Brain Wo Contrast  (neuro Protocol)  Result Date: 06/30/2017 CLINICAL DATA:  46 y/o F; right-sided infarcts. Focal neural deficit, stroke suspected. EXAM: MRI HEAD WITHOUT CONTRAST TECHNIQUE: Multiplanar, multiecho pulse sequences of the brain and surrounding structures were obtained without intravenous contrast. COMPARISON:  06/30/2017 CT head.  06/27/2017 MRI head. FINDINGS: Brain: Persistent subacute infarction within right frontal periventricular white matter, right anterior insula, and scattered throughout right MCA distribution. No new focus of reduced diffusion to suggest interval acute intracranial infarction. Chronic right parietal infarction with laminar necrosis an additional several scattered foci of cortical T1 shortening compatible with laminar necrosis throughout the right MCA distribution. Background of mild chronic microvascular ischemic changes and parenchymal volume loss of the brain. There are a few punctate foci of susceptibility hypointensity within foci of chronic infarction in the right posterior frontal and parietal lobes compatible with hemosiderin deposition. Small chronic hemosiderin line lacunar infarct of the right globus pallidus and right caudate head small chronic lacunar infarct within the left lateral cerebellar hemisphere. Vascular: Poor flow related signal within the right M1 and distal MCA circulation when compared with the left. Skull and upper cervical spine: Normal marrow signal. Sinuses/Orbits: Negative. Other: None. IMPRESSION: 1. Multiple stable subacute and chronic infarcts in the right MCA distribution. No new acute intracranial infarction. No acute hemorrhage identified. 2. Background of mild chronic microvascular ischemic changes and parenchymal volume loss. 3. Poor flow related signal in the right M1 and distal MCA circulation may represent high-grade stenosis or occlusion. Electronically Signed   By: Mitzi Hansen M.D.   On: 06/30/2017 20:30    Procedures Procedures  (including critical care time)  Medications Ordered in ED Medications - No data to display   Initial Impression / Assessment and Plan / ED Course  I have reviewed the triage vital signs and the nursing notes.  Pertinent labs & imaging results that were available during my care of the patient were reviewed by me and considered in my medical decision making (see chart for details).     Patient in early part of July with a significant right MCA stroke with residual neuro deficits. Originally taken care of at [facility] Patient with recent admission here from August 3 to August 5 with concern for recurrent acute stroke but MRI was consistent more with a subacute event. Patient today at about 9 the morning started with left arm numbness and numbness to the top and bottom of her feet and distal part of her leg. Patient was concerned about recurrent stroke.  MRI here today shows no acute event. Discussed with the neural hospitalist patient will follow-up with neurology at [facility] as scheduled in the next few days.  Patient still has some mild hypokalemia. Potassium today's 3.2. Final Clinical Impressions(s) / ED Diagnoses   Final diagnoses:  Left sided numbness    New Prescriptions New Prescriptions   No medications on file     Vanetta Mulders, MD 06/30/17 2059

## 2017-06-30 NOTE — Discharge Instructions (Signed)
Today's MRI shows no evidence of any new stroke. [facility]. Continue your current medications. Return for any new or worse symptoms.

## 2017-06-30 NOTE — ED Triage Notes (Signed)
Pt presents for evaluation of numbness to L foot/leg starting around 0930 this AM. Pt reports d/c last night for new CVA with L sided deficits including weakness and facial droop. Pt reports taking plavix. Reports numbness to leg has improved slightly.

## 2019-06-19 IMAGING — MR MR HEAD W/O CM
8 of 10 series · 34 of 48 positions shown · non-contrast
Comparison: 06/30/2017 CT head.  06/27/2017 MRI head.

CLINICAL DATA: 46 y/o F; right-sided infarcts. Focal neural
deficit, stroke suspected.

EXAM:
MRI HEAD WITHOUT CONTRAST
TECHNIQUE: Multiplanar, multiecho pulse sequences of the brain and surrounding
structures were obtained without intravenous contrast.

[Series 3: FLAIR · sagittal · 5.0mm · 0.47mm/px · 3 of 23 slices shown (1 of 2)]
[im 1/23]
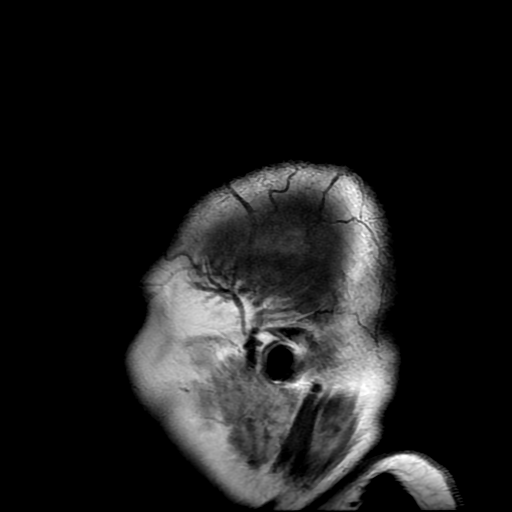
[im 12/23]
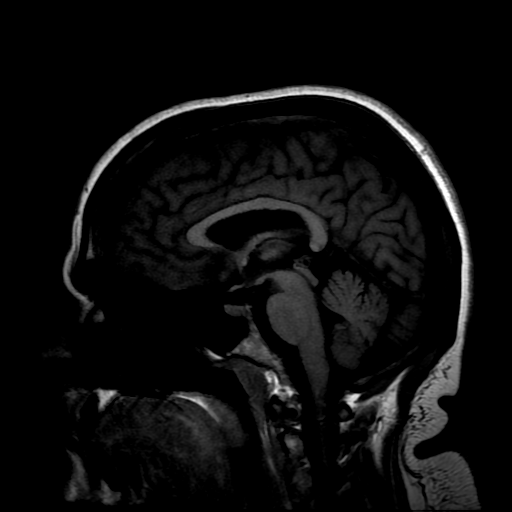
[im 23/23]
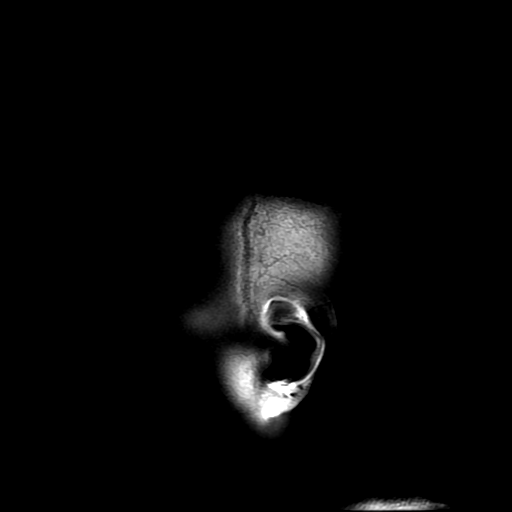

[Series 5: DWI · axial · 3.0mm · 0.94mm/px · z∈[-129,+8]mm · 9 of 94 slices shown (1 of 2)]
[im 1/94]
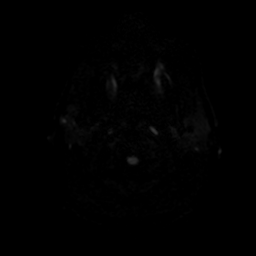
[im 12/94]
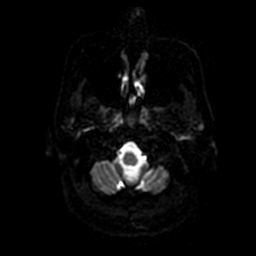
[im 24/94]
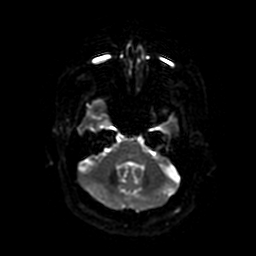
[im 35/94]
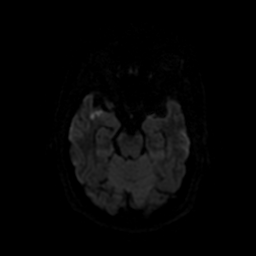
[im 47/94]
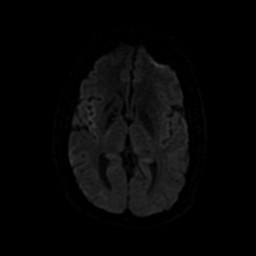
[im 59/94]
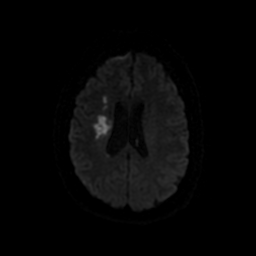
[im 70/94]
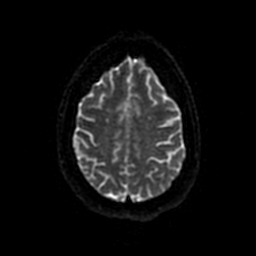
[im 82/94]
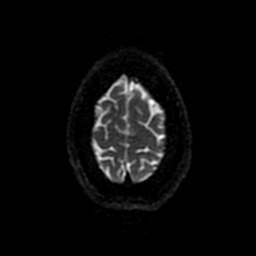
[im 94/94]
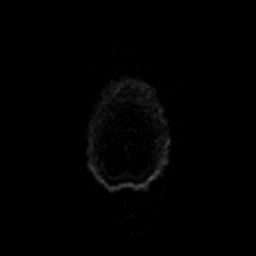

[Series 6: DWI · coronal · 4.0mm · 0.94mm/px · 6 of 66 slices shown (2 of 2)]
[im 1/66]
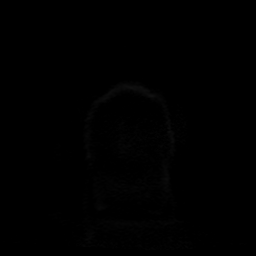
[im 14/66]
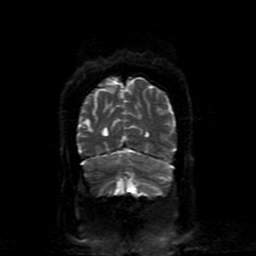
[im 27/66]
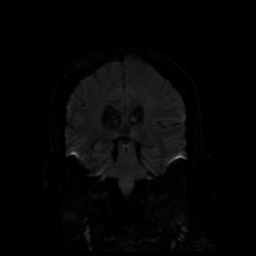
[im 40/66]
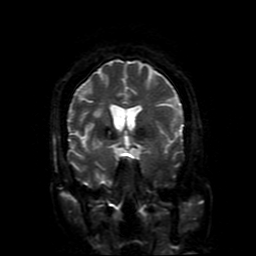
[im 53/66]
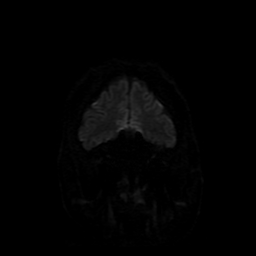
[im 66/66]
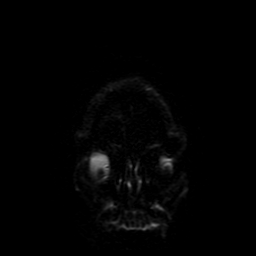

[Series 7: T2 · axial · 5.0mm · 0.43mm/px · z∈[-130,+8]mm · 2 of 24 slices shown (1 of 2)]
[im 1/24]
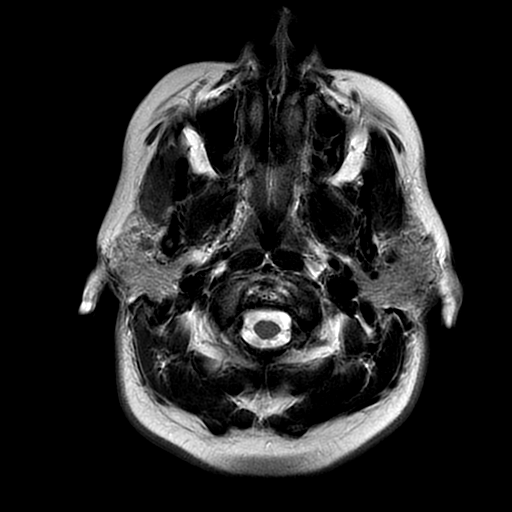
[im 24/24]
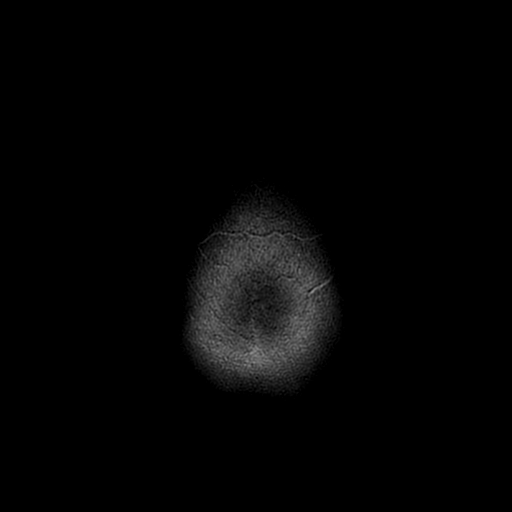

[Series 8: FLAIR · axial · 3.0mm · 0.43mm/px · z∈[-131,+8]mm · 3 of 32 slices shown (2 of 2)]
[im 1/32]
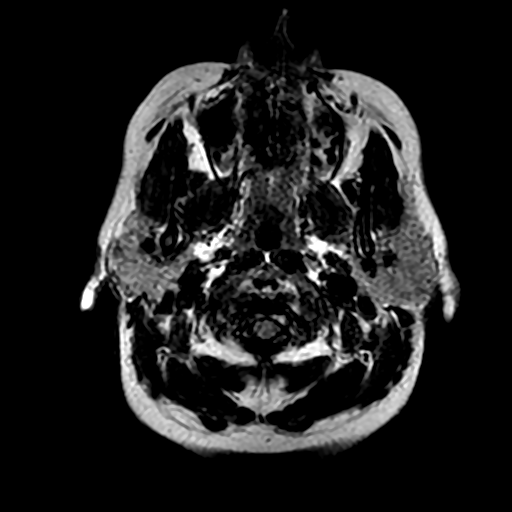
[im 16/32]
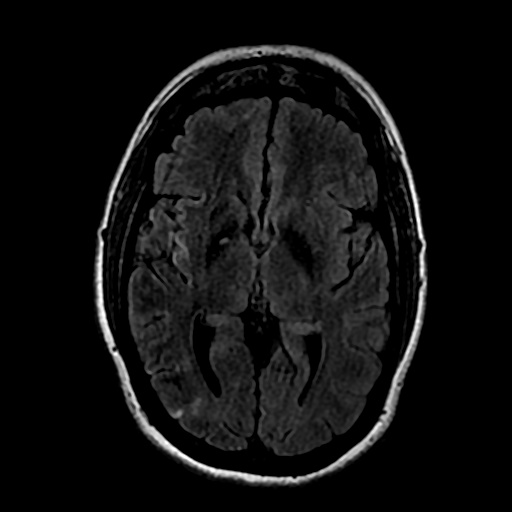
[im 32/32]
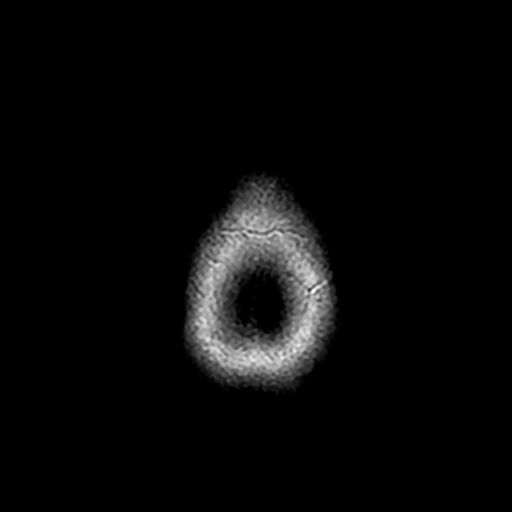

[Series 11: T2 · coronal · 5.0mm · 0.39mm/px · 3 of 28 slices shown (2 of 2)]
[im 1/28]
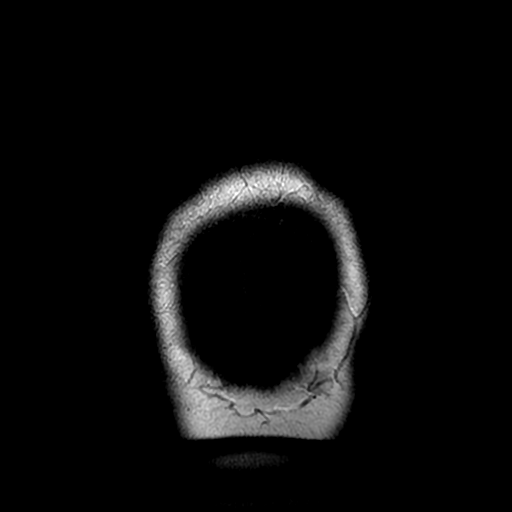
[im 14/28]
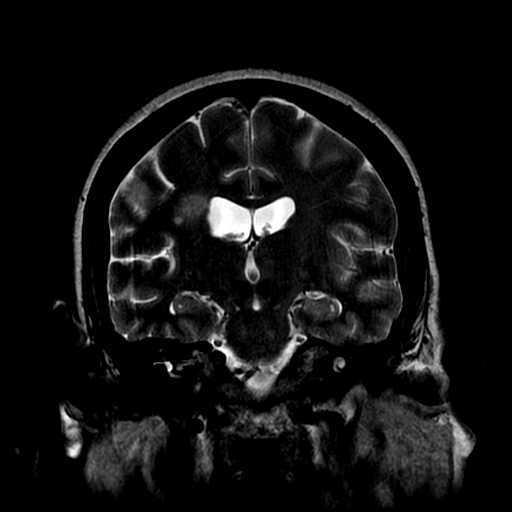
[im 28/28]
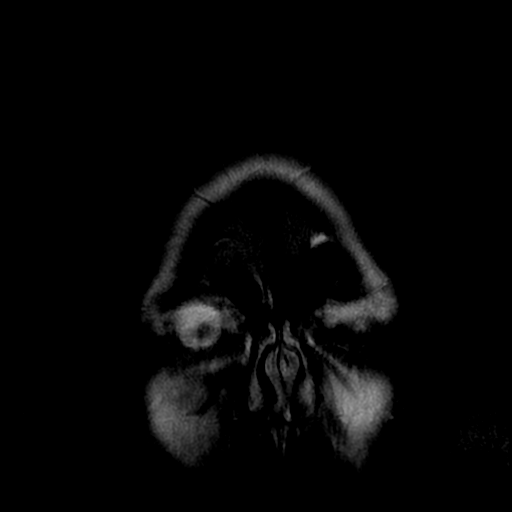

[Series 550: ADC · axial · 3.0mm · 0.94mm/px · z∈[-129,+8]mm · 5 of 47 slices shown (1 of 2)]
[im 1/47]
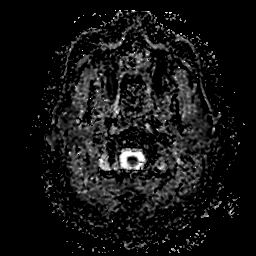
[im 12/47]
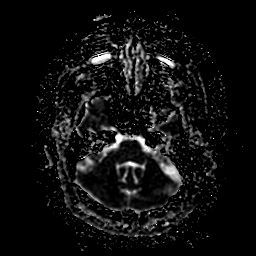
[im 24/47]
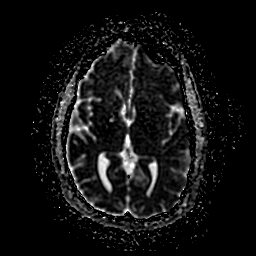
[im 35/47]
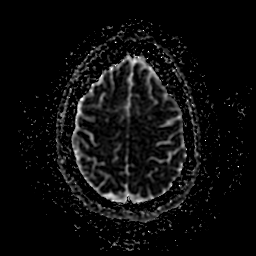
[im 47/47]
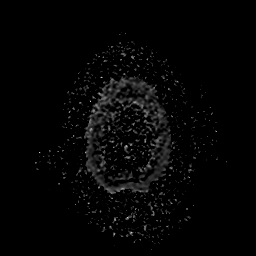

[Series 650: ADC · coronal · 4.0mm · 0.94mm/px · 3 of 33 slices shown (2 of 2)]
[im 1/33]
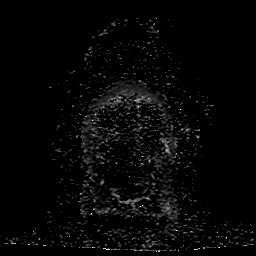
[im 17/33]
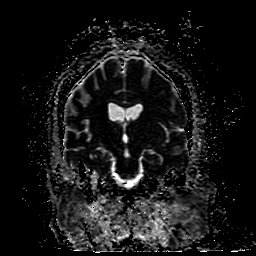
[im 33/33]
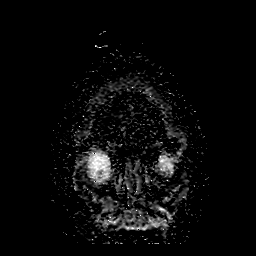

[34 of 48 positions shown; findings below may reference images not displayed]

FINDINGS: Brain: Persistent subacute infarction within right frontal
periventricular white matter, right anterior insula, and scattered
throughout right MCA distribution. No new focus of reduced diffusion
to suggest interval acute intracranial infarction.

Chronic right parietal infarction with laminar necrosis an
additional several scattered foci of cortical T1 shortening
compatible with laminar necrosis throughout the right MCA
distribution.

Background of mild chronic microvascular ischemic changes and
parenchymal volume loss of the brain. There are a few punctate foci
of susceptibility hypointensity within foci of chronic infarction in
the right posterior frontal and parietal lobes compatible with
hemosiderin deposition. Small chronic hemosiderin line lacunar
infarct of the right globus pallidus and right caudate head small
chronic lacunar infarct within the left lateral cerebellar
hemisphere.

Vascular: Poor flow related signal within the right M1 and distal
MCA circulation when compared with the left.

Skull and upper cervical spine: Normal marrow signal.

Sinuses/Orbits: Negative.

Other: None.
IMPRESSION: 1. Multiple stable subacute and chronic infarcts in the right MCA
distribution. No new acute intracranial infarction. No acute
hemorrhage identified.
2. Background of mild chronic microvascular ischemic changes and
parenchymal volume loss.
3. Poor flow related signal in the right M1 and distal MCA
circulation may represent high-grade stenosis or occlusion.

By: Evelio Vara M.D.
